# Patient Record
Sex: Male | Born: 1955 | Race: White | Hispanic: No | Marital: Married | Smoking: Current every day smoker
Health system: Southern US, Community
[De-identification: ages and names within clinical notes are randomized; demographics above are authoritative.]

## PROBLEM LIST (undated history)

## (undated) DIAGNOSIS — F319 Bipolar disorder, unspecified: Secondary | ICD-10-CM

## (undated) DIAGNOSIS — I2129 ST elevation (STEMI) myocardial infarction involving other sites: Secondary | ICD-10-CM

## (undated) DIAGNOSIS — F17200 Nicotine dependence, unspecified, uncomplicated: Secondary | ICD-10-CM

## (undated) DIAGNOSIS — I2510 Atherosclerotic heart disease of native coronary artery without angina pectoris: Secondary | ICD-10-CM

## (undated) DIAGNOSIS — I469 Cardiac arrest, cause unspecified: Secondary | ICD-10-CM

---

## 2004-03-31 ENCOUNTER — Other Ambulatory Visit: Payer: Self-pay

## 2004-03-31 ENCOUNTER — Inpatient Hospital Stay: Payer: Self-pay | Admitting: Cardiology

## 2004-04-02 ENCOUNTER — Other Ambulatory Visit: Payer: Self-pay

## 2004-04-22 ENCOUNTER — Encounter: Payer: Self-pay | Admitting: Cardiology

## 2004-05-11 ENCOUNTER — Encounter: Payer: Self-pay | Admitting: Cardiology

## 2004-06-08 ENCOUNTER — Emergency Department: Payer: Self-pay | Admitting: Internal Medicine

## 2005-01-20 ENCOUNTER — Other Ambulatory Visit: Payer: Self-pay

## 2005-01-20 ENCOUNTER — Emergency Department: Payer: Self-pay | Admitting: Emergency Medicine

## 2005-06-28 ENCOUNTER — Emergency Department: Payer: Self-pay | Admitting: Unknown Physician Specialty

## 2006-11-10 ENCOUNTER — Emergency Department: Payer: Self-pay | Admitting: Emergency Medicine

## 2007-03-04 ENCOUNTER — Emergency Department: Payer: Self-pay | Admitting: Emergency Medicine

## 2007-03-17 ENCOUNTER — Ambulatory Visit: Payer: Self-pay | Admitting: Urology

## 2008-03-14 ENCOUNTER — Emergency Department: Payer: Self-pay | Admitting: Emergency Medicine

## 2008-03-30 ENCOUNTER — Emergency Department: Payer: Self-pay | Admitting: Emergency Medicine

## 2009-02-09 ENCOUNTER — Emergency Department: Payer: Self-pay | Admitting: Emergency Medicine

## 2009-03-19 ENCOUNTER — Ambulatory Visit: Payer: Self-pay | Admitting: Unknown Physician Specialty

## 2009-03-20 ENCOUNTER — Ambulatory Visit: Payer: Self-pay | Admitting: Unknown Physician Specialty

## 2011-12-11 ENCOUNTER — Emergency Department: Payer: Self-pay | Admitting: Emergency Medicine

## 2011-12-24 ENCOUNTER — Encounter: Payer: Self-pay | Admitting: Nurse Practitioner

## 2011-12-24 ENCOUNTER — Encounter: Payer: Self-pay | Admitting: Cardiothoracic Surgery

## 2012-01-10 ENCOUNTER — Encounter: Payer: Self-pay | Admitting: Nurse Practitioner

## 2012-01-10 ENCOUNTER — Encounter: Payer: Self-pay | Admitting: Cardiothoracic Surgery

## 2012-02-11 ENCOUNTER — Encounter: Payer: Self-pay | Admitting: Orthopedic Surgery

## 2014-02-07 ENCOUNTER — Encounter (HOSPITAL_COMMUNITY)
Admission: EM | Disposition: A | Discharge status: Home / Self Care | Payer: Self-pay | Source: Home / Self Care | Attending: Interventional Cardiology

## 2014-02-07 ENCOUNTER — Inpatient Hospital Stay (HOSPITAL_COMMUNITY): Payer: PRIVATE HEALTH INSURANCE

## 2014-02-07 ENCOUNTER — Encounter (HOSPITAL_COMMUNITY): Payer: Self-pay | Admitting: Interventional Cardiology

## 2014-02-07 ENCOUNTER — Emergency Department: Payer: Self-pay | Admitting: Emergency Medicine

## 2014-02-07 ENCOUNTER — Inpatient Hospital Stay (HOSPITAL_COMMUNITY)
Admission: EM | Admit: 2014-02-07 | Discharge: 2014-02-10 | DRG: 250 | Disposition: A | Discharge status: Home / Self Care | Payer: PRIVATE HEALTH INSURANCE | Attending: Interventional Cardiology | Admitting: Interventional Cardiology

## 2014-02-07 DIAGNOSIS — F32A Depression, unspecified: Secondary | ICD-10-CM

## 2014-02-07 DIAGNOSIS — I469 Cardiac arrest, cause unspecified: Secondary | ICD-10-CM | POA: Diagnosis not present

## 2014-02-07 DIAGNOSIS — Z955 Presence of coronary angioplasty implant and graft: Secondary | ICD-10-CM

## 2014-02-07 DIAGNOSIS — I209 Angina pectoris, unspecified: Secondary | ICD-10-CM

## 2014-02-07 DIAGNOSIS — E876 Hypokalemia: Secondary | ICD-10-CM | POA: Diagnosis not present

## 2014-02-07 DIAGNOSIS — Y712 Prosthetic and other implants, materials and accessory cardiovascular devices associated with adverse incidents: Secondary | ICD-10-CM | POA: Diagnosis present

## 2014-02-07 DIAGNOSIS — T82857A Stenosis of cardiac prosthetic devices, implants and grafts, initial encounter: Principal | ICD-10-CM | POA: Diagnosis present

## 2014-02-07 DIAGNOSIS — I25119 Atherosclerotic heart disease of native coronary artery with unspecified angina pectoris: Secondary | ICD-10-CM

## 2014-02-07 DIAGNOSIS — I2129 ST elevation (STEMI) myocardial infarction involving other sites: Secondary | ICD-10-CM | POA: Diagnosis present

## 2014-02-07 DIAGNOSIS — F319 Bipolar disorder, unspecified: Secondary | ICD-10-CM | POA: Clinically undetermined

## 2014-02-07 DIAGNOSIS — Z716 Tobacco abuse counseling: Secondary | ICD-10-CM | POA: Diagnosis present

## 2014-02-07 DIAGNOSIS — F17200 Nicotine dependence, unspecified, uncomplicated: Secondary | ICD-10-CM | POA: Diagnosis present

## 2014-02-07 DIAGNOSIS — F3162 Bipolar disorder, current episode mixed, moderate: Secondary | ICD-10-CM

## 2014-02-07 DIAGNOSIS — F329 Major depressive disorder, single episode, unspecified: Secondary | ICD-10-CM

## 2014-02-07 DIAGNOSIS — I959 Hypotension, unspecified: Secondary | ICD-10-CM

## 2014-02-07 DIAGNOSIS — I2102 ST elevation (STEMI) myocardial infarction involving left anterior descending coronary artery: Secondary | ICD-10-CM

## 2014-02-07 DIAGNOSIS — J9600 Acute respiratory failure, unspecified whether with hypoxia or hypercapnia: Secondary | ICD-10-CM

## 2014-02-07 DIAGNOSIS — I4901 Ventricular fibrillation: Secondary | ICD-10-CM | POA: Diagnosis present

## 2014-02-07 DIAGNOSIS — I2510 Atherosclerotic heart disease of native coronary artery without angina pectoris: Secondary | ICD-10-CM | POA: Diagnosis not present

## 2014-02-07 DIAGNOSIS — Z8674 Personal history of sudden cardiac arrest: Secondary | ICD-10-CM

## 2014-02-07 HISTORY — DX: Cardiac arrest, cause unspecified: I46.9

## 2014-02-07 HISTORY — DX: ST elevation (STEMI) myocardial infarction involving other sites: I21.29

## 2014-02-07 HISTORY — DX: Bipolar disorder, unspecified: F31.9

## 2014-02-07 HISTORY — DX: Nicotine dependence, unspecified, uncomplicated: F17.200

## 2014-02-07 HISTORY — PX: LEFT HEART CATHETERIZATION WITH CORONARY ANGIOGRAM: SHX5451

## 2014-02-07 HISTORY — DX: Atherosclerotic heart disease of native coronary artery without angina pectoris: I25.10

## 2014-02-07 LAB — POCT I-STAT 3, ART BLOOD GAS (G3+)
ACID-BASE EXCESS: 1 mmol/L (ref 0.0–2.0)
Bicarbonate: 24.2 mEq/L — ABNORMAL HIGH (ref 20.0–24.0)
O2 Saturation: 99 %
PH ART: 7.452 — AB (ref 7.350–7.450)
TCO2: 25 mmol/L (ref 0–100)
pCO2 arterial: 34.5 mmHg — ABNORMAL LOW (ref 35.0–45.0)
pO2, Arterial: 137 mmHg — ABNORMAL HIGH (ref 80.0–100.0)

## 2014-02-07 LAB — COMPREHENSIVE METABOLIC PANEL
ALBUMIN: 3.4 g/dL (ref 3.4–5.0)
ALBUMIN: 3 g/dL — AB (ref 3.5–5.2)
ALK PHOS: 144 U/L — AB
ALK PHOS: 125 U/L — AB (ref 39–117)
ALT: 120 U/L — AB (ref 0–53)
AST: 62 U/L — AB (ref 15–37)
AST: 160 U/L — AB (ref 0–37)
Anion Gap: 9 (ref 7–16)
Anion gap: 11 (ref 5–15)
BUN: 13 mg/dL (ref 7–18)
BUN: 14 mg/dL (ref 6–23)
Bilirubin,Total: 0.2 mg/dL (ref 0.2–1.0)
CALCIUM: 8.1 mg/dL — AB (ref 8.4–10.5)
CHLORIDE: 107 mmol/L (ref 98–107)
CO2: 27 mmol/L (ref 21–32)
CO2: 25 mEq/L (ref 19–32)
CREATININE: 1.05 mg/dL (ref 0.60–1.30)
Calcium, Total: 8.6 mg/dL (ref 8.5–10.1)
Chloride: 104 mEq/L (ref 96–112)
Creatinine, Ser: 0.87 mg/dL (ref 0.50–1.35)
EGFR (African American): >60
EGFR (Non-African Amer.): >60
GFR calc Af Amer: >90 mL/min (ref >90)
GFR calc non Af Amer: >90 mL/min (ref >90)
GLUCOSE: 104 mg/dL — AB (ref 65–99)
Glucose, Bld: 207 mg/dL — ABNORMAL HIGH (ref 70–99)
OSMOLALITY: 285 (ref 275–301)
POTASSIUM: 3.2 meq/L — AB (ref 3.7–5.3)
Potassium: 3.4 mmol/L — ABNORMAL LOW (ref 3.5–5.1)
SGPT (ALT): 67 U/L — ABNORMAL HIGH
SODIUM: 143 mmol/L (ref 136–145)
SODIUM: 140 meq/L (ref 137–147)
TOTAL PROTEIN: 6.4 g/dL (ref 6.0–8.3)
Total Bilirubin: 0.4 mg/dL (ref 0.3–1.2)
Total Protein: 7.4 g/dL (ref 6.4–8.2)

## 2014-02-07 LAB — GLUCOSE, CAPILLARY
GLUCOSE-CAPILLARY: 189 mg/dL — AB (ref 70–99)
Glucose-Capillary: 129 mg/dL — ABNORMAL HIGH (ref 70–99)

## 2014-02-07 LAB — CBC
HCT: 50.4 % (ref 40.0–52.0)
HEMATOCRIT: 40.8 % (ref 39.0–52.0)
HEMOGLOBIN: 14 g/dL (ref 13.0–17.0)
HGB: 16.5 g/dL (ref 13.0–18.0)
MCH: 29.3 pg (ref 26.0–34.0)
MCH: 29.6 pg (ref 26.0–34.0)
MCHC: 32.6 g/dL (ref 32.0–36.0)
MCHC: 34.3 g/dL (ref 30.0–36.0)
MCV: 90 fL (ref 80–100)
MCV: 86.3 fL (ref 78.0–100.0)
PLATELETS: 202 10*3/uL (ref 150–440)
Platelets: 196 10*3/uL (ref 150–400)
RBC: 5.62 10*6/uL (ref 4.40–5.90)
RBC: 4.73 MIL/uL (ref 4.22–5.81)
RDW: 15.4 % — ABNORMAL HIGH (ref 11.5–14.5)
RDW: 15 % (ref 11.5–15.5)
WBC: 14.2 10*3/uL — AB (ref 3.8–10.6)
WBC: 14.1 10*3/uL — ABNORMAL HIGH (ref 4.0–10.5)

## 2014-02-07 LAB — CK TOTAL AND CKMB (NOT AT ARMC)
CK TOTAL: 821 U/L — AB (ref 7–232)
CK TOTAL: 1356 U/L — AB (ref 7–232)
CK, MB: 32.8 ng/mL — AB (ref 0.3–4.0)
CK, MB: 110.8 ng/mL (ref 0.3–4.0)
CK, MB: 65.5 ng/mL — AB (ref 0.3–4.0)
RELATIVE INDEX: 4 — AB (ref 0.0–2.5)
RELATIVE INDEX: 6.1 — AB (ref 0.0–2.5)
RELATIVE INDEX: 4.8 — AB (ref 0.0–2.5)
Total CK: 1812 U/L — ABNORMAL HIGH (ref 7–232)

## 2014-02-07 LAB — APTT: aPTT: >200 seconds (ref 24–37)

## 2014-02-07 LAB — PROTIME-INR
INR: 1.23 (ref 0.00–1.49)
Prothrombin Time: 15.5 seconds — ABNORMAL HIGH (ref 11.6–15.2)

## 2014-02-07 LAB — LIPID PANEL
CHOL/HDL RATIO: 4.6 ratio
CHOLESTEROL: 147 mg/dL (ref 0–200)
HDL: 32 mg/dL — AB (ref >39)
LDL Cholesterol: 100 mg/dL — ABNORMAL HIGH (ref 0–99)
TRIGLYCERIDES: 76 mg/dL (ref <150)
VLDL: 15 mg/dL (ref 0–40)

## 2014-02-07 LAB — TROPONIN I
Troponin I: >20 ng/mL (ref <0.30)
Troponin I: >20 ng/mL (ref <0.30)
Troponin-I: <0.02 ng/mL

## 2014-02-07 LAB — RAPID URINE DRUG SCREEN, HOSP PERFORMED
Amphetamines: NOT DETECTED
Barbiturates: NOT DETECTED
Benzodiazepines: POSITIVE — AB
Cocaine: NOT DETECTED
OPIATES: NOT DETECTED
TETRAHYDROCANNABINOL: NOT DETECTED

## 2014-02-07 LAB — BASIC METABOLIC PANEL
ANION GAP: 14 (ref 5–15)
BUN: 13 mg/dL (ref 6–23)
CALCIUM: 8.3 mg/dL — AB (ref 8.4–10.5)
CO2: 23 mEq/L (ref 19–32)
CREATININE: 0.72 mg/dL (ref 0.50–1.35)
Chloride: 106 mEq/L (ref 96–112)
Glucose, Bld: 100 mg/dL — ABNORMAL HIGH (ref 70–99)
Potassium: 3.9 mEq/L (ref 3.7–5.3)
SODIUM: 143 meq/L (ref 137–147)

## 2014-02-07 LAB — PLATELET COUNT: PLATELETS: 199 10*3/uL (ref 150–400)

## 2014-02-07 LAB — HEMOGLOBIN A1C
Hgb A1c MFr Bld: 5.7 % — ABNORMAL HIGH (ref <5.7)
Mean Plasma Glucose: 117 mg/dL — ABNORMAL HIGH (ref <117)

## 2014-02-07 LAB — POCT ACTIVATED CLOTTING TIME
ACTIVATED CLOTTING TIME: 185 s
ACTIVATED CLOTTING TIME: 320 s
Activated Clotting Time: 214 seconds

## 2014-02-07 SURGERY — LEFT HEART CATHETERIZATION WITH CORONARY ANGIOGRAM
Anesthesia: LOCAL

## 2014-02-07 MED ORDER — NITROGLYCERIN 1 MG/10 ML FOR IR/CATH LAB
INTRA_ARTERIAL | Status: AC
  Filled 2014-02-07: qty 10

## 2014-02-07 MED ORDER — CETYLPYRIDINIUM CHLORIDE 0.05 % MT LIQD
7.0000 mL | Freq: Two times a day (BID) | OROMUCOSAL | Status: DC
  Administered 2014-02-07 – 2014-02-09 (×5): 7 mL via OROMUCOSAL

## 2014-02-07 MED ORDER — ACETAMINOPHEN 325 MG PO TABS
650.0000 mg | ORAL_TABLET | ORAL | Status: DC | PRN
  Administered 2014-02-09 (×2): 650 mg via ORAL
  Filled 2014-02-07 (×2): qty 2

## 2014-02-07 MED ORDER — AMIODARONE HCL IN DEXTROSE 360-4.14 MG/200ML-% IV SOLN
60.0000 mg/h | INTRAVENOUS | Status: AC
  Administered 2014-02-07: 60 mg/h via INTRAVENOUS
  Filled 2014-02-07: qty 200

## 2014-02-07 MED ORDER — HEPARIN SODIUM (PORCINE) 1000 UNIT/ML IJ SOLN
INTRAMUSCULAR | Status: AC
  Filled 2014-02-07: qty 1

## 2014-02-07 MED ORDER — CHLORHEXIDINE GLUCONATE 0.12 % MT SOLN
15.0000 mL | Freq: Two times a day (BID) | OROMUCOSAL | Status: DC
  Administered 2014-02-07: 15 mL via OROMUCOSAL
  Filled 2014-02-07 (×2): qty 15

## 2014-02-07 MED ORDER — ONDANSETRON HCL 4 MG/2ML IJ SOLN: 4.0000 mg | Freq: Four times a day (QID) | INTRAMUSCULAR | Status: DC | PRN

## 2014-02-07 MED ORDER — HEPARIN (PORCINE) IN NACL 2-0.9 UNIT/ML-% IJ SOLN
INTRAMUSCULAR | Status: AC
Start: 2014-02-07 — End: 2014-02-07
  Filled 2014-02-07: qty 1500

## 2014-02-07 MED ORDER — TICAGRELOR 90 MG PO TABS
ORAL_TABLET | ORAL | Status: AC
  Filled 2014-02-07: qty 1

## 2014-02-07 MED ORDER — TIROFIBAN HCL IV 5 MG/100ML
INTRAVENOUS | Status: AC
  Filled 2014-02-07: qty 100

## 2014-02-07 MED ORDER — SODIUM CHLORIDE 0.9 % IV SOLN
INTRAVENOUS | Status: AC
  Administered 2014-02-07: 12:00:00 via INTRAVENOUS

## 2014-02-07 MED ORDER — POTASSIUM CHLORIDE 10 MEQ/100ML IV SOLN
10.0000 meq | INTRAVENOUS | Status: AC
  Administered 2014-02-07 (×2): 10 meq via INTRAVENOUS
  Filled 2014-02-07 (×2): qty 100

## 2014-02-07 MED ORDER — LIDOCAINE HCL (PF) 1 % IJ SOLN
INTRAMUSCULAR | Status: AC
  Filled 2014-02-07: qty 30

## 2014-02-07 MED ORDER — PROPOFOL 10 MG/ML IV EMUL: 5.0000 ug/kg/min | INTRAVENOUS | Status: DC

## 2014-02-07 MED ORDER — CETYLPYRIDINIUM CHLORIDE 0.05 % MT LIQD
7.0000 mL | Freq: Four times a day (QID) | OROMUCOSAL | Status: DC
  Administered 2014-02-07 (×2): 7 mL via OROMUCOSAL

## 2014-02-07 MED ORDER — TIROFIBAN HCL IV 5 MG/100ML
0.1500 ug/kg/min | INTRAVENOUS | Status: AC
  Administered 2014-02-07: 0.15 ug/kg/min via INTRAVENOUS
  Filled 2014-02-07 (×2): qty 100

## 2014-02-07 MED ORDER — ASPIRIN 81 MG PO CHEW
81.0000 mg | CHEWABLE_TABLET | Freq: Every day | ORAL | Status: DC
  Administered 2014-02-08 – 2014-02-10 (×3): 81 mg via ORAL
  Filled 2014-02-07 (×3): qty 1

## 2014-02-07 MED ORDER — POTASSIUM CHLORIDE 20 MEQ/15ML (10%) PO LIQD
40.0000 meq | Freq: Three times a day (TID) | ORAL | Status: DC
Start: 2014-02-07 — End: 2014-02-07
  Filled 2014-02-07 (×2): qty 30

## 2014-02-07 MED ORDER — AMIODARONE HCL IN DEXTROSE 360-4.14 MG/200ML-% IV SOLN
30.0000 mg/h | INTRAVENOUS | Status: DC
  Administered 2014-02-07 – 2014-02-08 (×3): 30 mg/h via INTRAVENOUS
  Filled 2014-02-07 (×5): qty 200

## 2014-02-07 MED ORDER — TICAGRELOR 90 MG PO TABS
90.0000 mg | ORAL_TABLET | Freq: Two times a day (BID) | ORAL | Status: DC
  Administered 2014-02-07 – 2014-02-10 (×6): 90 mg via ORAL
  Filled 2014-02-07 (×9): qty 1

## 2014-02-07 MED ORDER — HEPARIN SODIUM (PORCINE) 5000 UNIT/ML IJ SOLN
5000.0000 [IU] | Freq: Three times a day (TID) | INTRAMUSCULAR | Status: DC
  Administered 2014-02-07 – 2014-02-10 (×9): 5000 [IU] via SUBCUTANEOUS
  Filled 2014-02-07 (×12): qty 1

## 2014-02-07 MED ORDER — VERAPAMIL HCL 2.5 MG/ML IV SOLN
INTRAVENOUS | Status: AC
  Filled 2014-02-07: qty 2

## 2014-02-07 NOTE — Progress Notes (Signed)
Bedside swallow performed by RN. No signs of aspiration, lungs sounds clear. No complaints from patient except for slight tenderness in throat. Rise PaganiniURRY, Dametri Ozburn R, RN

## 2014-02-07 NOTE — Progress Notes (Signed)
**Note De-Identified Ian Soto Obfuscation** ETT advanced 3cm; currently 25 @ lip

## 2014-02-07 NOTE — Progress Notes (Signed)
Attempted to advance OG tube due to x-ray result. OG tube removed for advancement, and then tried to re-insert. Unable to re-insert OG tube x3 attemps. Did not attempt any further due to patient becoming agitated/increase of respiratory rate and increased coughing/gagging and fighting against the tube. Will attempt to re-insert at a later time. Rise PaganiniURRY, Shriyans Kuenzi R, RN

## 2014-02-07 NOTE — H&P (Signed)
Ian Soto is an 58 y.o. male.   Primary Cardiologist: Dr. Lady GaryFath PMD: Chief Complaint: chest pain HPI: 58 year old man with a history of coronary artery disease who has had PCI in the past. He had approximately 3 hours of chest discomfort. He called EMS and upon arrival to the [place] [facility], became unresponsive. He had cardiac arrest with his initial rhythm being ventricular fibrillation. He was apparently defibrillated once. He had 5-less than 10 minutes of CPR by one report. He had less than a minute of CPR by another report.  He apparently did receive epinephrine and sodium bicarbonate. Due to "agonal" breathing, he was intubated after return of spontaneous circulation. He is transferred here for emergent cardiac catheterization. Patient is intubated and unable to give any further history. All history is obtained from personnel.    No past medical history on file.  No past surgical history on file.  No family history on file. Social History:  has no tobacco, alcohol, and drug history on file.  Allergies: Allergies not on file  No prescriptions prior to admission    No results found for this or any previous visit (from the past 48 hour(s)). No results found.  ROS: Unable to obtain   OBJECTIVE:   Vitals:  There were no vitals filed for this visit. I&O's:  No intake or output data in the 24 hours ending 02/07/14 0531 TELEMETRY: Reviewed telemetry pt in normal sinus rhythm. ECG did show high degree AV block after de are fibrillation transiently:     PHYSICAL EXAM General:  intubated sedated  Head:   Normal cephalic and atramatic  Lungs:  Coarse breath sounds  Heart:   HRRR S1 S2   Abdomen: abdomen soft and non-tender Msk:   unable to assess  Extremities: No edema.   Neuro: intubated sedated  Psych:  Normal affect, responds appropriately  LABS: Basic Metabolic Panel: No results found for this basename: NA, K, CL, CO2, GLUCOSE, BUN, CREATININE, CALCIUM, MG, PHOS,  in  the last 72 hours Liver Function Tests: No results found for this basename: AST, ALT, ALKPHOS, BILITOT, PROT, ALBUMIN,  in the last 72 hours No results found for this basename: LIPASE, AMYLASE,  in the last 72 hours CBC: No results found for this basename: WBC, NEUTROABS, HGB, HCT, MCV, PLT,  in the last 72 hours Cardiac Enzymes: No results found for this basename: CKTOTAL, CKMB, CKMBINDEX, TROPONINI,  in the last 72 hours BNP: No components found with this basename: POCBNP,  D-Dimer: No results found for this basename: DDIMER,  in the last 72 hours Hemoglobin A1C: No results found for this basename: HGBA1C,  in the last 72 hours Fasting Lipid Panel: No results found for this basename: CHOL, HDL, LDLCALC, TRIG, CHOLHDL, LDLDIRECT,  in the last 72 hours Thyroid Function Tests: No results found for this basename: TSH, T4TOTAL, FREET3, T3FREE, THYROIDAB,  in the last 72 hours Anemia Panel: No results found for this basename: VITAMINB12, FOLATE, FERRITIN, TIBC, IRON, RETICCTPCT,  in the last 72 hours Coag Panel:   No results found for this basename: INR, PROTIME       Assessment/Plan Acute lateral wall MI/cardiac arrest: IV amiodarone. Emergency cardiac cath. Further plans based on cath results. Only medication that he was on noted was metoprolol. No Plavix use as far as we know. The patient did receive IV heparin.   Spoke to Constellation BrandsPCCM.  Not planning hypothermia protocol due to short duration of the arrest.  Emilly Lavey S. 02/07/2014, 5:31 AM

## 2014-02-07 NOTE — Progress Notes (Signed)
LB/PCCM PROGRESS NOTE  Evaluated patient at bedside for extubation. Ian Soto was spontaneously awake, alert, and oriented and able to communicate through writing. He had been weaning for several hours and had an RSBI of 15. He was oxygenating well. He did complain of some throat tightness, however, a cuff leak test was performed and he had adequate phonation around the ETT. He was extubated. He is currently breathing comfortably and oxygenating well on [place]. Will continue to monitor.   Joneen RoachPaul Hoffman, ACNP [facility] Pulmonology/Critical Care Pager [phone] or [phone]

## 2014-02-07 NOTE — Progress Notes (Signed)
Foley catheter removed at 1730. Rise PaganiniURRY, Giovanni Biby R, RN

## 2014-02-07 NOTE — Progress Notes (Signed)
CRITICAL VALUE ALERT  Critical value received:  CK MB 32.8, trop >20  Date of notification:  02/07/2014  Time of notification:  0752  Critical value read back:Yes.    Nurse who received alert:  Viviana Trimble, Jeanice LimHolly Cartner  Pt admitted as Ian Soto,  post cath procedure with intervention, value expected.

## 2014-02-07 NOTE — Consult Note (Signed)
PULMONARY / CRITICAL CARE MEDICINE   Name: Ian Soto MRN: [id number] DOB: 10/21/1955    ADMISSION DATE:  02/07/2014 CONSULTATION DATE:  9/30  REFERRING MD :  Cards  CHIEF COMPLAINT:  Chest pain  INITIAL PRESENTATION: Vented to cath lab post V fib arrest  STUDIES:  CC 9/30 with diagonal aspiration   SIGNIFICANT EVENTS: 9/30 v fib arrst   HISTORY OF PRESENT ILLNESS:   58 [facility] WM [facility] 7/10 and in ED developed VF arrest. CPR x 5 min , one epi with ROSC. Intubated for transport and [facility] with aspiration of first diagonal. Pervious LAD stent patent. Post cath awake and follows commands, therefore no hypothermia protocol. He carries diagnosis of depression and bipolar disorder and is on depakote.  PAST MEDICAL HISTORY :   has a past medical history of Cardiac arrest; Acute myocardial infarction of other lateral wall, initial episode of care; and Coronary atherosclerosis of native coronary artery.  has no past surgical history on file. Prior to Admission medications   Not on File   Allergies not on file nkda FAMILY HISTORY:  has no family status information on file.  na SOCIAL HISTORY:  na  REVIEW OF SYSTEMS:  NA  SUBJECTIVE:   VITAL SIGNS: Temp:  [97.4 F (36.3 C)] 97.4 F (36.3 C) (09/30 0722) Pulse Rate:  [63] 63 (09/30 0534) Resp:  [14] 14 (09/30 0722) BP: (105)/(77) 105/77 mmHg (09/30 0722) SpO2:  [100 %] 100 % (09/30 0722) FiO2 (%):  [100 %] 100 % (09/30 0535) HEMODYNAMICS:   VENTILATOR SETTINGS: Vent Mode:  [-] PRVC FiO2 (%):  [100 %] 100 % Set Rate:  [14 bmp] 14 bmp Vt Set:  [600 mL] 600 mL PEEP:  [5 cmH20] 5 cmH20 INTAKE / OUTPUT: No intake or output data in the 24 hours ending 02/07/14 0746  PHYSICAL EXAMINATION: General:  Sedate WM on vent  Neuro:  Reported to follow commands HEENT:  No jvd/LAN Cardiovascular:  HSR RRR Lungs: CTA Abdomen: Soft + bs Musculoskeletal:  Intact Skin:  Cool  and dry  LABS:  CBC  Recent Labs Lab 02/07/14 0645  WBC 14.1*  HGB 14.0  HCT 40.8  PLT 196   Coag's  Recent Labs Lab 02/07/14 0645  INR 1.23   BMET No results found for this basename: NA, K, CL, CO2, BUN, CREATININE, GLUCOSE,  in the last 168 hours Electrolytes No results found for this basename: CALCIUM, MG, PHOS,  in the last 168 hours Sepsis Markers No results found for this basename: LATICACIDVEN, PROCALCITON, O2SATVEN,  in the last 168 hours ABG No results found for this basename: PHART, PCO2ART, PO2ART,  in the last 168 hours Liver Enzymes No results found for this basename: AST, ALT, ALKPHOS, BILITOT, ALBUMIN,  in the last 168 hours Cardiac Enzymes No results found for this basename: TROPONINI, PROBNP,  in the last 168 hours Glucose No results found for this basename: GLUCAP,  in the last 168 hours  Imaging No results found.   ASSESSMENT / PLAN:  PULMONARY OETT 9/30>> A: Intubated post arrest for transport P:   Wean as tolerated Vent bundle Will check CXR and ABG now, if appears within tolerance will consider a wean to extubate. SBT daily.  CARDIOVASCULAR CVL A:  Post V fib arrest 9/30 Post CC 9/30 with diagonal aspiration  Previous LAD stent Was evidently purposeful upon [facility] and downtime <5 minutes in the ambulance only. P:  Per Cards Recommend decreasing propofol as tolerated inorder to avoid placement on TLC and pressors. Not a cooling candidate.  RENAL A:   Hypokalemia P:   Follow creatine post CC BMET in AM. Replace electrolytes as indicated.  GASTROINTESTINAL A:   No acute issue P:   PPI If unable to extubate today then will start TF.  HEMATOLOGIC A:   Post CC on anticoagulation P:  Follow CBC  INFECTIOUS A:  No acute process P:   Monitor clinically  ENDOCRINE A:   No acute process  P:   Monitor CBGs.  NEUROLOGIC A:   Post V fib arrest, <5  Minutes down time with quality cpr. Awake and follows  commands post CC Depression Bpolar P:   RASS goal: -1 No hypothermia Neuro evals as needed Resume Depakote in future  Family updated:  9/30 0700 , no family available.  Interdisciplinary Family Meeting v Palliative Care Meeting:   [facility] Minor ACNP Adolph Pollack PCCM Pager [phone] till 3 pm If no answer page [phone] 02/07/2014, 7:57 AM  TODAY'S SUMMARY:  58 yo with hx previous LAD stent who presented to [facility] with chest pain, V fib arrest with < 5 minutes downtime. CPR x 5 min, epi x 1,tx to [facility] for aspiration of first diagonal per CC.  Above note edited in full.  I have personally obtained a history, examined the patient, evaluated laboratory and imaging results, formulated the assessment and plan and placed orders. CRITICAL CARE: The patient is critically ill with multiple organ systems failure and requires high complexity decision making for assessment and support, frequent evaluation and titration of therapies, application of advanced monitoring technologies and extensive interpretation of multiple databases. Critical Care Time devoted to patient care services described in this note is 55 minutes.   Alyson Reedy, M.D. [facility] Pulmonary/Critical Care Medicine. Pager: [phone]. After hours pager: [phone].  02/07/2014, 7:46 AM

## 2014-02-07 NOTE — Progress Notes (Signed)
Inpatient Diabetes Program Recommendations  AACE/ADA: New Consensus Statement on Inpatient Glycemic Control (2013)  Target Ranges:  Prepandial:   less than 140 mg/dL      Peak postprandial:   less than 180 mg/dL (1-2 hours)      Critically ill patients:  140 - 180 mg/dL   Results for Ian Soto, Lavi K (MRN [id number]) as of 02/07/2014 11:10  Ref. Range 02/07/2014 06:45  Glucose Latest Range: 70-99 mg/dL 244207 (H)   Results for Ian Soto, Lajuan K (MRN [id number]) as of 02/07/2014 11:10  Ref. Range 02/07/2014 07:20  Glucose-Capillary Latest Range: 70-99 mg/dL 644189 (H)   Diabetes history: No Outpatient Diabetes medications: NA Current orders for Inpatient glycemic control: None  Inpatient Diabetes Program Recommendations Correction (SSI): Noted initial lab glucose of 207mg /dl this morning at 0:346:45 and finger stick glucose 189 mg/dl at 7:427:20 am. Please consider ordering CBGs with Novolog correction Q4H if appropriate. HgbA1C: No documented history of diabetes noted in chart. Please consider ordering an A1C to evaluate glycemic control over the past 2-3 months.  Thanks, Orlando PennerMarie Chyna Kneece, RN, MSN, CCRN  Diabetes Coordinator Inpatient Diabetes Program [phone] (Team Pager) [phone] (AP office) [phone] [facility] office)

## 2014-02-07 NOTE — CV Procedure (Addendum)
PROCEDURE:  Left heart catheterization with selective coronary angiography, left ventriculogram.  Percutaneous thrombectomy of the first diagonal; PCI first diagonal; IVUS LAD.  INDICATIONS:  Cardiac arrest, lateral ST elevation MI  Emergency consent  PROCEDURE TECHNIQUE:  After Xylocaine anesthesia a 50F slender sheath was placed in the right radial artery with a single anterior needle wall stick.  IV heparin was given. Right coronary angiography was done using a Judkins R4 guide catheter.  Left coronary angiography was done using a CLS 3.0 guide catheter.  The diagonal intervention was performed. Please see below for details. Intravascular ultrasound of the LAD was then performed. Left ventriculography was done using a pigtail catheter.  A TR band was used for hemostasis.   CONTRAST:  Total of 125 cc.  COMPLICATIONS:  None.    HEMODYNAMICS:  Aortic pressure was 95/70; LV pressure was 96/9; LVEDP 14.  There was no gradient between the left ventricle and aorta.    ANGIOGRAPHIC DATA:   The left main coronary artery is widely patent.  The left anterior descending artery is a large vessel which wraps around the apex. There is a stent in the proximal LAD. There is haziness in the proximal portion of the stent. There is mild to moderate disease at the proximal edge of the stent. Before the origin of the stent, there is some dye staining noted in the territory of the first diagonal.  The left circumflex artery is a large vessel. There is a large, branching first obtuse marginal which is widely patent. The remainder of the circumflex is medium size and widely patent.  The right coronary artery is a large, dominant vessel. There is mild, proximal disease. The posterior descending artery is large and widely patent. The posterior lateral artery is medium size and widely patent.  LEFT VENTRICULOGRAM:  Left ventricular angiogram was done in the 30 RAO projection and revealed anterolateral  hypokinesis and moderately decreased systolic function with an estimated ejection fraction of 40 %.  LVEDP was 14 mmHg.  PCI NARRATIVE: A CLS 3.0 guiding catheter was used to engage the left main. IV heparin was given. ACT was used to check that the heparin was therapeutic. IV tirofiban was started. A pro-water wire was placed down into the LAD. A BMW wire was then advanced into the first diagonal. A pro-water was removed and a priority 1 aspiration catheter was advanced into the diagonal vessel which was occluded. One pass was made. A small amount of debris was removed. A 2.0 x 12 balloon was then used to open up the first diagonal. Multiple inflations were performed. TIMI-3 flow was restored into the diagonal. The vessel increased in size after several doses of intra-coronary nitroglycerin. It still was not bigger than a 2.0 vessel. We went back with a 2.0 balloon and did several more inflations at higher pressure. There is no visible dissection. There is a approximately 40% stenosis in the mid vessel. Given that there is TIMI-3 flow in the vessel was small in diameter, we elected not to stent.  Due to the haziness in the LAD near the origin of this diagonal, there is concern for thrombus in the previously placed LAD stent. A pro-water was placed back into the LAD. The intravascular ultrasound catheter was advanced and IVUS of the LAD was performed. There were several areas of in-stent restenosis, but the luminal area never decreased below 4 mm.   The wires were removed. TIMI-3 flow was preserved in the LAD and  in the first diagonal.  IMPRESSIONS:  1. Normal left main coronary artery. 2.  Patent stent in the proximal left anterior descending artery  with moderate in-stent restenosis. By intravascular ultrasound, the cross-sectional luminal area did not decrease below 4 mm. Occluded first diagonal which was successfully treated with balloon angioplasty (2.0 mm x 8 balloon), with restoration of TIMI-3  flow. 3.  Patent left circumflex artery and its branches. 4.  Mild disease in the  right coronary artery. 5.  Moderately decreased left ventricular systolic functionWith anterolateral wall motion abnormality.  LVEDP 14  mmHg.  Ejection fraction 40%.  RECOMMENDATION:  The patient be watched in the ICU. I discussed the case with PCCM.  The patient will not be started on the hypothermia protocol. Continue dual antiplatelet therapy for now. It is not mandatory that he maintain dual antiplatelet therapy since a fresh stent was not placed. The diagonal vessel that was occluded his long and does feed a fair amount of myocardium, despite the fact that it is small in diameter. This was the cause of his VF arrest.

## 2014-02-07 NOTE — Progress Notes (Signed)
**Note De-Identified Floyd Wade Obfuscation** Ventilator changed due to malfunction with ventilation. STAT ABG collected 20 minutes post change.

## 2014-02-07 NOTE — Procedures (Signed)
**Note De-Identified Antrone Walla Obfuscation** Extubation Procedure Note  Patient Details:   Name: Abbie SonsWilliam K Monnin DOB: 06/06/1955 MRN: [id number]   Airway Documentation:     Evaluation  O2 sats: stable throughout Complications: No apparent complications Patient did tolerate procedure well. Bilateral Breath Sounds: Clear Suctioning: Airway Yes + leak, no stridor noted  Sahir Tolson, Megan SalonWendy Cooper 02/07/2014, 4:25 PM

## 2014-02-07 NOTE — Progress Notes (Signed)
Chaplain responded to stemi page in ED.  EMT reported no family with patient or en route.  Chaplain remained with patient to Cath Lab and informed staff that if any family arrive where they will be waiting.  Chaplain will follow up as needed.  02/07/14 0534  Clinical Encounter Type  Visited With Patient;Health care provider  Visit Type Initial;Code;Critical Care;ED  Referral From Care management  Stress Factors  Patient Stress Factors Health changes   Erroll LunaOvercash, Darya Bigler A, Chaplain

## 2014-02-08 DIAGNOSIS — I2129 ST elevation (STEMI) myocardial infarction involving other sites: Secondary | ICD-10-CM

## 2014-02-08 DIAGNOSIS — I517 Cardiomegaly: Secondary | ICD-10-CM

## 2014-02-08 LAB — POCT I-STAT 3, ART BLOOD GAS (G3+)
Bicarbonate: 23.8 mEq/L (ref 20.0–24.0)
O2 SAT: 97 %
PCO2 ART: 36.1 mmHg (ref 35.0–45.0)
PH ART: 7.427 (ref 7.350–7.450)
Patient temperature: 98.6
TCO2: 25 mmol/L (ref 0–100)
pO2, Arterial: 84 mmHg (ref 80.0–100.0)

## 2014-02-08 LAB — BASIC METABOLIC PANEL
Anion gap: 14 (ref 5–15)
BUN: 13 mg/dL (ref 6–23)
CO2: 21 meq/L (ref 19–32)
CREATININE: 0.72 mg/dL (ref 0.50–1.35)
Calcium: 8.3 mg/dL — ABNORMAL LOW (ref 8.4–10.5)
Chloride: 105 mEq/L (ref 96–112)
GFR calc Af Amer: >90 mL/min (ref >90)
GFR calc non Af Amer: >90 mL/min (ref >90)
Glucose, Bld: 113 mg/dL — ABNORMAL HIGH (ref 70–99)
Potassium: 3.5 mEq/L — ABNORMAL LOW (ref 3.7–5.3)
Sodium: 140 mEq/L (ref 137–147)

## 2014-02-08 LAB — CBC
HEMATOCRIT: 47.5 % (ref 39.0–52.0)
Hemoglobin: 15.6 g/dL (ref 13.0–17.0)
MCH: 28.9 pg (ref 26.0–34.0)
MCHC: 32.8 g/dL (ref 30.0–36.0)
MCV: 88.1 fL (ref 78.0–100.0)
Platelets: 112 10*3/uL — ABNORMAL LOW (ref 150–400)
RBC: 5.39 MIL/uL (ref 4.22–5.81)
RDW: 15.5 % (ref 11.5–15.5)
WBC: 9.4 10*3/uL (ref 4.0–10.5)

## 2014-02-08 LAB — MAGNESIUM: Magnesium: 1.9 mg/dL (ref 1.5–2.5)

## 2014-02-08 LAB — PHOSPHORUS: Phosphorus: 2.6 mg/dL (ref 2.3–4.6)

## 2014-02-08 MED ORDER — ATORVASTATIN CALCIUM 80 MG PO TABS
80.0000 mg | ORAL_TABLET | Freq: Every day | ORAL | Status: DC
  Administered 2014-02-08 – 2014-02-09 (×2): 80 mg via ORAL
  Filled 2014-02-08 (×4): qty 1

## 2014-02-08 MED ORDER — PERFLUTREN LIPID MICROSPHERE
INTRAVENOUS | Status: AC
  Filled 2014-02-08: qty 10

## 2014-02-08 MED ORDER — PERFLUTREN LIPID MICROSPHERE
1.0000 mL | INTRAVENOUS | Status: AC | PRN
  Administered 2014-02-08: 2 mL via INTRAVENOUS
  Filled 2014-02-08: qty 10

## 2014-02-08 MED ORDER — MAGNESIUM SULFATE 40 MG/ML IJ SOLN
2.0000 g | Freq: Once | INTRAMUSCULAR | Status: AC
  Administered 2014-02-08: 2 g via INTRAVENOUS
  Filled 2014-02-08: qty 50

## 2014-02-08 MED ORDER — METOPROLOL TARTRATE 12.5 MG HALF TABLET
12.5000 mg | ORAL_TABLET | Freq: Two times a day (BID) | ORAL | Status: DC
  Administered 2014-02-08 (×2): 12.5 mg via ORAL
  Filled 2014-02-08 (×4): qty 1

## 2014-02-08 MED ORDER — POTASSIUM CHLORIDE CRYS ER 20 MEQ PO TBCR
40.0000 meq | EXTENDED_RELEASE_TABLET | Freq: Once | ORAL | Status: AC
  Administered 2014-02-08: 40 meq via ORAL
  Filled 2014-02-08: qty 2

## 2014-02-08 NOTE — Progress Notes (Signed)
Echocardiogram 2D Echocardiogram with Definity has been performed.  Seung Nidiffer 02/08/2014, 11:25 AM

## 2014-02-08 NOTE — Progress Notes (Signed)
CARDIAC REHAB PHASE I   PRE:  Rate/Rhythm: 84 SR    BP: sitting 128/71    SaO2: 99 RA  MODE:  Ambulation: 300 ft   POST:  Rate/Rhythm: 92 SR    BP: sitting 133/83     SaO2: 100 RA  Pt stiff and sore upon getting OOB (first time up). Became steadier, less stiff, with distance. No major c/o except soreness from coughing. To recliner, VSS however did have a few PVCs. Gave MI book and discussed restrictions. Also gave diet sheet to begin reading. Will f/u in am. [id number]   Ian LovettReeve, Ian Soto [place] Station[place] CES, ACSM 02/08/2014 3:14 PM

## 2014-02-08 NOTE — Progress Notes (Signed)
[facility] REPLACEMENT PROTOCOL FOR AM LAB REPLACEMENT ONLY  The patient does not apply for the [facility] Electrolyte [facility] Protocol based on the criteria listed below:    Is urine output >/= 0.5 ml/kg/hr for the last 6 hours? No. Patient's UOP is 0.4 ml/kg/hr  Abnormal electrolyte(s): K3.5   If a panic level lab has been reported, has the CCM MD in charge been notified? Yes.  .   Physician:  Myrtie Soman Alva,MD  Trena Dunavan Luverne 02/08/2014 6:03 AM

## 2014-02-08 NOTE — Care Management Note (Signed)
    Page 1 of 1   02/08/2014     2:18:33 PM CARE MANAGEMENT NOTE 02/08/2014  Patient:  Ian Soto,[name]   Account Number:  [id number]  Date Initiated:  02/08/2014  Documentation initiated by:  Junius CreamerWELL,DEBBIE  Subjective/Objective Assessment:   adm w mi     Action/Plan:   lives w wife   Anticipated DC Date:     Anticipated DC Plan:        DC Planning Services  CM consult  Medication Assistance      Choice offered to / List presented to:             Status of service:   Medicare Important Message given?   (If response is "NO", the following Medicare IM given date fields will be blank) Date Medicare IM given:   Medicare IM given by:   Date Additional Medicare IM given:   Additional Medicare IM given by:    Discharge Disposition:    Per UR Regulation:  Reviewed for med. necessity/level of care/duration of stay  If discussed at Long Length of Stay Meetings, dates discussed:    Comments:  10/1 1417 debbie Pollyann Roa rn,bsn spoke w pt. he states he has ins. gave pt 30day free and copay assist card for brilinta.

## 2014-02-08 NOTE — Progress Notes (Deleted)
CARDIAC REHAB PHASE I   PRE:  Rate/Rhythm: 90 SR    BP: sitting 91/35, standing 61/44    SaO2: 100 RA  MODE:  Ambulation: stood, back to recliner   POST:  Rate/Rhythm: 106 ST    BP: sitting 91/40     SaO2:   Pt attempted to stand however BP down to 61/44 with standing. Became dizzy and had to sit back. BP better once sitting. Left in recliner at pts request. Will f/u tomorrow. Sts she gets a tight feeling in her stomach with eating, hasn't been able to eat much. Sts it also seems that she gets SOB with eating.  1415-1440   Ian Soto CES, ACSM 02/08/2014 2:37 PM 

## 2014-02-08 NOTE — Progress Notes (Signed)
    Subjective:  Denies CP or dyspnea   Objective:  Filed Vitals:   02/08/14 0500 02/08/14 0602 02/08/14 0700 02/08/14 0730  BP: 105/67 108/64 100/67   Pulse: 51 66 71   Temp:    98.4 F (36.9 C)  TempSrc:    Oral  Resp: 19 21 21    Height:      Weight:      SpO2: 99% 98% 95%     Intake/Output from previous day:  Intake/Output Summary (Last 24 hours) at 02/08/14 0817 Last data filed at 02/08/14 0700  Gross per 24 hour  Intake 1824.66 ml  Output    755 ml  Net 1069.66 ml    Physical Exam: Physical exam: Well-developed well-nourished in no acute distress.  Skin is warm and dry.  HEENT is normal.  Neck is supple.  Chest is clear to auscultation with normal expansion.  Cardiovascular exam is regular rate and rhythm.  Abdominal exam nontender or distended. No masses palpated. Extremities show no edema. Right radial cath site with no hematoma neuro grossly intact    Lab Results: Basic Metabolic Panel:  Recent Labs  16/02/9608/30/15 2016 02/08/14 0400  NA 143 140  K 3.9 3.5*  CL 106 105  CO2 23 21  GLUCOSE 100* 113*  BUN 13 13  CREATININE 0.72 0.72  CALCIUM 8.3* 8.3*  MG  --  1.9  PHOS  --  2.6   CBC:  Recent Labs  02/07/14 0645 02/07/14 1329 02/08/14 0400  WBC 14.1*  --  9.4  HGB 14.0  --  15.6  HCT 40.8  --  47.5  MCV 86.3  --  88.1  PLT 196 199 112*   Cardiac Enzymes:  Recent Labs  02/07/14 0645 02/07/14 1329 02/07/14 2016  CKTOTAL 821* 1812* 1356*  CKMB 32.8* 110.8* 65.5*  TROPONINI >20.00* >20.00* >20.00*     Assessment/Plan:  1 status post myocardial infarction-patient had PTCA of diagonal. Continue aspirin, brilinta. Add Lipitor 80 mg daily. Add metoprolol 12.5 mg by mouth twice a day. Check echocardiogram. Blood pressure borderline. If LV function reduced will add ACE inhibitor if blood pressure allows. 2 status post cardiac arrest-occurred in the setting of acute infarct. Telemetry continues to show nonsustained ventricular  tachycardia. Add low-dose beta blocker. Discontinue amiodarone. Follow telemetry. 3 hypokalemia-supplement 4 hypomagnesemia-supplement 5 tobacco abuse-patient counseled on discontinuing.  Olga MillersBrian Crenshaw 02/08/2014, 8:17 AM

## 2014-02-09 DIAGNOSIS — Z720 Tobacco use: Secondary | ICD-10-CM

## 2014-02-09 LAB — CBC
HCT: 42 % (ref 39.0–52.0)
Hemoglobin: 14.1 g/dL (ref 13.0–17.0)
MCH: 29.7 pg (ref 26.0–34.0)
MCHC: 33.6 g/dL (ref 30.0–36.0)
MCV: 88.4 fL (ref 78.0–100.0)
Platelets: 171 10*3/uL (ref 150–400)
RBC: 4.75 MIL/uL (ref 4.22–5.81)
RDW: 15 % (ref 11.5–15.5)
WBC: 11.8 10*3/uL — ABNORMAL HIGH (ref 4.0–10.5)

## 2014-02-09 LAB — COMPREHENSIVE METABOLIC PANEL
ALT: 67 U/L — AB (ref 0–53)
AST: 62 U/L — ABNORMAL HIGH (ref 0–37)
Albumin: 3 g/dL — ABNORMAL LOW (ref 3.5–5.2)
Alkaline Phosphatase: 113 U/L (ref 39–117)
Anion gap: 13 (ref 5–15)
BUN: 13 mg/dL (ref 6–23)
CO2: 21 mEq/L (ref 19–32)
Calcium: 8.6 mg/dL (ref 8.4–10.5)
Chloride: 105 mEq/L (ref 96–112)
Creatinine, Ser: 0.78 mg/dL (ref 0.50–1.35)
GFR calc non Af Amer: >90 mL/min (ref >90)
GLUCOSE: 96 mg/dL (ref 70–99)
Potassium: 3.7 mEq/L (ref 3.7–5.3)
Sodium: 139 mEq/L (ref 137–147)
TOTAL PROTEIN: 6.8 g/dL (ref 6.0–8.3)
Total Bilirubin: 1 mg/dL (ref 0.3–1.2)

## 2014-02-09 MED ORDER — POTASSIUM CHLORIDE CRYS ER 20 MEQ PO TBCR
40.0000 meq | EXTENDED_RELEASE_TABLET | Freq: Once | ORAL | Status: AC
  Administered 2014-02-09: 40 meq via ORAL
  Filled 2014-02-09: qty 2

## 2014-02-09 MED ORDER — METOPROLOL TARTRATE 25 MG PO TABS
25.0000 mg | ORAL_TABLET | Freq: Two times a day (BID) | ORAL | Status: DC
  Administered 2014-02-09 – 2014-02-10 (×3): 25 mg via ORAL
  Filled 2014-02-09 (×3): qty 1

## 2014-02-09 NOTE — Progress Notes (Signed)
CARDIAC REHAB PHASE I   PRE:  Rate/Rhythm: 73 SR    BP: sitting 100/75    SaO2: 98 RA  MODE:  Ambulation: 500 ft   POST:  Rate/Rhythm: 85 SR    BP: sitting 117/78     SaO2: 98 RA  Tolerated well, no c/o. Ed completed. Voiced understanding and [facility]. [phone]   Elissa LovettReeve, Zophia Marrone [place]oveKristan CES, ACSM 02/09/2014 11:39 AM

## 2014-02-09 NOTE — Progress Notes (Signed)
    Subjective:  Denies CP or dyspnea   Objective:  Filed Vitals:   02/09/14 0313 02/09/14 0400 02/09/14 0600 02/09/14 0744  BP: 107/83 116/78 110/80 130/78  Pulse: 65 62 65 73  Temp: 98.3 F (36.8 C)   97.8 F (36.6 C)  TempSrc: Oral   Oral  Resp: 19 16 22 25   Height:      Weight:      SpO2: 97% 96% 98% 96%    Intake/Output from previous day:  Intake/Output Summary (Last 24 hours) at 02/09/14 0801 Last data filed at 02/09/14 0600  Gross per 24 hour  Intake    630 ml  Output   1530 ml  Net   -900 ml    Physical Exam: Physical exam: Well-developed well-nourished in no acute distress.  Skin is warm and dry.  HEENT is normal.  Neck is supple.  Chest is clear to auscultation with normal expansion.  Cardiovascular exam is regular rate and rhythm.  Abdominal exam nontender or distended. No masses palpated. Extremities show no edema. Right radial cath site with no hematoma neuro grossly intact    Lab Results: Basic Metabolic Panel:  Recent Labs  13/12/6507/30/15 2016 02/08/14 0400 02/09/14 0240  NA 143 140 139  K 3.9 3.5* 3.7  CL 106 105 105  CO2 23 21 21   GLUCOSE 100* 113* 96  BUN 13 13 13   CREATININE 0.72 0.72 0.78  CALCIUM 8.3* 8.3* 8.6  MG  --  1.9  --   PHOS  --  2.6  --    CBC:  Recent Labs  02/08/14 0400 02/09/14 0240  WBC 9.4 11.8*  HGB 15.6 14.1  HCT 47.5 42.0  MCV 88.1 88.4  PLT 112* 171   Cardiac Enzymes:  Recent Labs  02/07/14 0645 02/07/14 1329 02/07/14 2016  CKTOTAL 821* 1812* 1356*  CKMB 32.8* 110.8* 65.5*  TROPONINI >20.00* >20.00* >20.00*     Assessment/Plan:  1 status post myocardial infarction-patient had PTCA of diagonal. Continue aspirin, brilinta and lipitor. Increase metoprolol to 25 mg po BID. Echocardiogram shows EF 45-50. 2 status post cardiac arrest-occurred in the setting of acute infarct. Increase metoprolol to 25 mg po BID 3 hypokalemia-supplement 4 hypomagnesemia-supplement 5 tobacco abuse-patient counseled  on discontinuing. Transfer to telemetry with potential DC in AM. Olga MillersBrian Crenshaw 02/09/2014, 8:01 AM

## 2014-02-10 DIAGNOSIS — F3162 Bipolar disorder, current episode mixed, moderate: Secondary | ICD-10-CM

## 2014-02-10 DIAGNOSIS — I25119 Atherosclerotic heart disease of native coronary artery with unspecified angina pectoris: Secondary | ICD-10-CM

## 2014-02-10 DIAGNOSIS — I2102 ST elevation (STEMI) myocardial infarction involving left anterior descending coronary artery: Secondary | ICD-10-CM

## 2014-02-10 DIAGNOSIS — I469 Cardiac arrest, cause unspecified: Secondary | ICD-10-CM

## 2014-02-10 MED ORDER — TICAGRELOR 90 MG PO TABS: 90.0000 mg | ORAL_TABLET | Freq: Two times a day (BID) | ORAL | Status: AC

## 2014-02-10 MED ORDER — METOPROLOL TARTRATE 25 MG PO TABS: 25.0000 mg | ORAL_TABLET | Freq: Two times a day (BID) | ORAL | Status: DC

## 2014-02-10 MED ORDER — ASPIRIN 81 MG PO CHEW: 81.0000 mg | CHEWABLE_TABLET | Freq: Every day | ORAL | Status: AC

## 2014-02-10 MED ORDER — ATORVASTATIN CALCIUM 80 MG PO TABS: 80.0000 mg | ORAL_TABLET | Freq: Every day | ORAL | Status: DC

## 2014-02-10 MED ORDER — TICAGRELOR 90 MG PO TABS: 90.0000 mg | ORAL_TABLET | Freq: Two times a day (BID) | ORAL | Status: DC

## 2014-02-10 NOTE — Progress Notes (Signed)
CARDIAC REHAB PHASE I   PRE:  Rate/Rhythm: 68 SR  BP:  Supine:   Sitting: 106/81  Standing:    SaO2:   MODE:  Ambulation: 650 ft   POST:  Rate/Rhythm: 66  BP:  Supine:   Sitting: 115/69  Standing:    SaO2:  0750-0810 Pt walked 650 ft with steady gait. Tolerated well. No CP. Wants to go home. Did not sleep well.   Ian Nuttingharlene Tamantha Saline, RN BSN  02/10/2014 8:08 AM

## 2014-02-10 NOTE — Discharge Summary (Signed)
Physician Discharge Summary     Cardiologist:  Fath  Patient ID: Ian SonsWilliam K Sparger MRN: [id number] DOB/AGE: 57/08/1955 57 y.o.  Admit date: 02/07/2014 Discharge date: 02/10/2014  Admission Diagnoses:    Acute myocardial infarction of other lateral wall, initial episode of care  Discharge Diagnoses:  Active Problems:   Acute myocardial infarction of other lateral wall, initial episode of care   Cardiac arrest   Coronary atherosclerosis of native coronary artery   Bipolar 1 disorder, mixed, moderate   Depression   Acute respiratory failure   Hypotension, unspecified   Hypomagnesemia   Hypokalemia   Tobacco abuse  Discharged Condition: stable  Hospital Course:   58 year old man with a history of coronary artery disease who has had PCI in the past. He had approximately 3 hours of chest discomfort. He called EMS and upon arrival to the [facility], became unresponsive. He had cardiac arrest with his initial rhythm being ventricular fibrillation. He was apparently defibrillated once. He had 5-less than 10 minutes of CPR by one report. He had less than a minute of CPR by another report. He apparently did receive epinephrine and sodium bicarbonate. Due to "agonal" breathing, he was intubated after return of spontaneous circulation. He is transferred here for emergent cardiac catheterization. Patient is intubated and unable to give any further history. All history is obtained from personnel.   She was admitted and underwent emergent left heart cath which revealed normal left main coronary artery. Patent stent in the proximal left anterior descending artery with moderate in-stent restenosis. By intravascular ultrasound, the cross-sectional luminal area did not decrease below 4 mm. Occluded first diagonal which was successfully treated with balloon angioplasty (2.0 mm x 8 balloon), with restoration of TIMI-3 flow. Patent left circumflex artery and its branches. Mild disease in the right coronary  artery.  Moderately decreased left ventricular systolic functionWith anterolateral wall motion abnormality. LVEDP 14 mmHg. Ejection fraction 40%.  She was started on dual antiplatelet with ASA and Brilinta.  IV amiodarone, lipitor 80.  PCCM consulted.  Post cath he was awake and following commands, therefore no hypothermia protocol.   He was extubated on 02/07/14.  2D echo revealed an EF of 45-50%,  Hypokinesis of mid/apical lateral segments and mid/apical anterior segments, mild LVH.  Telemetry continued to show NSVT.  Beta blocker added and subsequently increased to 25mg  BID. Amiodarone stopped.  Magnesium and potassium were replaced.  Tobacco counseling given.  On 02/08/14 the patient ambulated 300 ft with cardiac rehab.  Chest was sore from coughing.  We will consider adding an ACE-I in the office if BP improves.  The patient was seen by Dr. Anne FuSkains who felt he was stable for DC home.  [facility]    Consults: PCCM, diabetes coor.   Significant Diagnostic Studies:   Lipid Panel     Component Value Date/Time   CHOL 147 02/07/2014 0645   TRIG 76 02/07/2014 0645   HDL 32* 02/07/2014 0645   CHOLHDL 4.6 02/07/2014 0645   VLDL 15 02/07/2014 0645   LDLCALC 100* 02/07/2014 0645     Echocardiogram Study Conclusions  - Left ventricle: Hypokinesis of mid/apical lateral segments and mid/apical anterior segments. The cavity size was mildly dilated. Wall thickness was increased in a pattern of mild LVH. Systolic function was mildly reduced. The estimated ejection fraction was in the range of 45% to 50%. - Right ventricle: The cavity size was normal. Systolic function was normal.     PROCEDURE:  Left heart catheterization with selective coronary angiography, left ventriculogram. Percutaneous thrombectomy of the first diagonal; PCI first diagonal; IVUS LAD.   INDICATIONS: Cardiac arrest, lateral ST elevation MI  Emergency consent  PROCEDURE TECHNIQUE: After Xylocaine  anesthesia a 79F slender sheath was placed in the right radial artery with a single anterior needle wall stick. IV heparin was given. Right coronary angiography was done using a Judkins R4 guide catheter. Left coronary angiography was done using a CLS 3.0 guide catheter. The diagonal intervention was performed. Please see below for details. Intravascular ultrasound of the LAD was then performed. Left ventriculography was done using a pigtail catheter. A TR band was used for hemostasis.  CONTRAST: Total of 125 cc.  COMPLICATIONS: None.  HEMODYNAMICS: Aortic pressure was 95/70; LV pressure was 96/9; LVEDP 14. There was no gradient between the left ventricle and aorta.  ANGIOGRAPHIC DATA: The left main coronary artery is widely patent.  The left anterior descending artery is a large vessel which wraps around the apex. There is a stent in the proximal LAD. There is haziness in the proximal portion of the stent. There is mild to moderate disease at the proximal edge of the stent. Before the origin of the stent, there is some dye staining noted in the territory of the first diagonal.  The left circumflex artery is a large vessel. There is a large, branching first obtuse marginal which is widely patent. The remainder of the circumflex is medium size and widely patent.  The right coronary artery is a large, dominant vessel. There is mild, proximal disease. The posterior descending artery is large and widely patent. The posterior lateral artery is medium size and widely patent.  LEFT VENTRICULOGRAM: Left ventricular angiogram was done in the 30 RAO projection and revealed anterolateral hypokinesis and moderately decreased systolic function with an estimated ejection fraction of 40 %. LVEDP was 14 mmHg.  PCI NARRATIVE: A CLS 3.0 guiding catheter was used to engage the left main. IV heparin was given. ACT was used to check that the heparin was therapeutic. IV tirofiban was started. A pro-water wire was placed down  into the LAD. A BMW wire was then advanced into the first diagonal. A pro-water was removed and a priority 1 aspiration catheter was advanced into the diagonal vessel which was occluded. One pass was made. A small amount of debris was removed. A 2.0 x 12 balloon was then used to open up the first diagonal. Multiple inflations were performed. TIMI-3 flow was restored into the diagonal. The vessel increased in size after several doses of intra-coronary nitroglycerin. It still was not bigger than a 2.0 vessel. We went back with a 2.0 balloon and did several more inflations at higher pressure. There is no visible dissection. There is a approximately 40% stenosis in the mid vessel. Given that there is TIMI-3 flow in the vessel was small in diameter, we elected not to stent.  Due to the haziness in the LAD near the origin of this diagonal, there is concern for thrombus in the previously placed LAD stent. A pro-water was placed back into the LAD. The intravascular ultrasound catheter was advanced and IVUS of the LAD was performed. There were several areas of in-stent restenosis, but the luminal area never decreased below 4 mm. The wires were removed. TIMI-3 flow was preserved in the LAD and in the first diagonal.  IMPRESSIONS:  1. Normal left main coronary artery. 2. Patent stent in the proximal left anterior descending artery with moderate in-stent restenosis.  By intravascular ultrasound, the cross-sectional luminal area did not decrease below 4 mm. Occluded first diagonal which was successfully treated with balloon angioplasty (2.0 mm x 8 balloon), with restoration of TIMI-3 flow. 3. Patent left circumflex artery and its branches. 4. Mild disease in the right coronary artery. 5. Moderately decreased left ventricular systolic functionWith anterolateral wall motion abnormality. LVEDP 14 mmHg. Ejection fraction 40%. RECOMMENDATION: The patient be watched in the ICU. I discussed the case with PCCM. The patient will  not be started on the hypothermia protocol. Continue dual antiplatelet therapy for now. It is not mandatory that he maintain dual antiplatelet therapy since a fresh stent was not placed. The diagonal vessel that was occluded his long and does feed a fair amount of myocardium, despite the fact that it is small in diameter. This was the cause of his VF arrest.   Treatments: See above  Discharge Exam: Blood pressure 108/74, pulse 60, temperature 97.6 F (36.4 C), temperature source Oral, resp. rate 18, height 5\' 11"  (1.803 m), weight 202 lb 9.6 oz (91.9 kg), SpO2 97.00%.   Disposition: Final discharge disposition not confirmed      Discharge Instructions   Amb Referral to Cardiac Rehabilitation    Complete by:  As directed   To [facility]     Diet - low sodium heart healthy    Complete by:  As directed      Discharge instructions    Complete by:  As directed   Monitor weight daily.  Low sodium diet.  If you gain 3 pounds in 24 hours, or 5 pounds in a week, call Dr. America Brown office for instructions.     Increase activity slowly    Complete by:  As directed             Medication List         aspirin 81 MG chewable tablet  Chew 1 tablet (81 mg total) by mouth daily.     atorvastatin 80 MG tablet  Commonly known as:  LIPITOR  Take 1 tablet (80 mg total) by mouth daily at 6 PM.     metoprolol tartrate 25 MG tablet  Commonly known as:  LOPRESSOR  Take 1 tablet (25 mg total) by mouth 2 (two) times daily.     ticagrelor 90 MG Tabs tablet  Commonly known as:  BRILINTA  Take 1 tablet (90 mg total) by mouth 2 (two) times daily.       Follow-up Information   Follow up with Dalia Heading., MD. (Call on Monday for a follow up appt with Dr. Lady Gary. )    Specialty:  Cardiology   Contact information:   [address] [place] [address] [phone]      Greater than 30 minutes was spent completing the patient's discharge.    SignedWilburt Finlay, PA-C 02/10/2014,  11:13 AM

## 2014-02-10 NOTE — Progress Notes (Signed)
    Subjective:  No CP. No SOB. Asked a question about neurosurgeon availability.   Objective:  Vital Signs in the last 24 hours: Temp:  [97.6 F (36.4 C)-99.2 F (37.3 C)] 97.6 F (36.4 C) (10/03 0526) Pulse Rate:  [60-87] 60 (10/03 0526) Resp:  [18-20] 18 (10/03 0526) BP: (108-114)/(71-74) 108/74 mmHg (10/03 0526) SpO2:  [97 %-98 %] 97 % (10/03 0526)  Intake/Output from previous day: 10/02 0701 - 10/03 0700 In: 600 [P.O.:600] Out: -    Physical Exam: General: Well developed, well nourished, in no acute distress. Head:  Normocephalic and atraumatic. Lungs: Clear to auscultation and percussion. Heart: Normal S1 and S2.  No murmur, rubs or gallops.  Abdomen: soft, non-tender, positive bowel sounds. Protuberant Extremities: No clubbing or cyanosis. No edema. Neurologic: Alert and oriented x 3.    Lab Results:  Recent Labs  02/08/14 0400 02/09/14 0240  WBC 9.4 11.8*  HGB 15.6 14.1  PLT 112* 171    Recent Labs  02/08/14 0400 02/09/14 0240  NA 140 139  K 3.5* 3.7  CL 105 105  CO2 21 21  GLUCOSE 113* 96  BUN 13 13  CREATININE 0.72 0.78    Recent Labs  02/07/14 1329 02/07/14 2016  TROPONINI >20.00* >20.00*   Hepatic Function Panel  Recent Labs  02/09/14 0240  PROT 6.8  ALBUMIN 3.0*  AST 62*  ALT 67*  ALKPHOS 113  BILITOT 1.0   Telemetry: Rare PVC, no VT Personally viewed.   Assessment/Plan:  Active Problems:   Acute myocardial infarction of other lateral wall, initial episode of care   Cardiac arrest   Coronary atherosclerosis of native coronary artery   Bipolar 1 disorder, mixed, moderate   Depression   Acute respiratory failure   Hypotension, unspecified  Scheduled Meds: . antiseptic oral rinse  7 mL Mouth Rinse BID  . aspirin  81 mg Oral Daily  . atorvastatin  80 mg Oral q1800  . heparin  5,000 Units Subcutaneous 3 times per day  . metoprolol tartrate  25 mg Oral BID  . ticagrelor  90 mg Oral BID   Continuous Infusions:  PRN  Meds:.acetaminophen, ondansetron (ZOFRAN) IV   -OK for DC -DAPT discussed. Importance, risk of stent thrombosis.  -Meds reviewed.  -EF 45%. Consider addition of ACE-I as outpt. BP low currently.   Neftaly Inzunza 02/10/2014, 9:50 AM

## 2014-02-10 NOTE — Progress Notes (Signed)
Pt D/C home.  Alert and oriented x4.  No c/o pain.  Education given on diet, activity, meds, and follow-up care and appointments.  Pt verbalized understanding.  IV D/C'd.  Tele D/C'd.  Pt taken home by daughter.

## 2014-02-11 NOTE — Discharge Summary (Signed)
Personally seen and examined. Agree with above. Doing well post MI See progress note for details Donato SchultzSKAINS, MARK, MD

## 2014-04-19 ENCOUNTER — Encounter (HOSPITAL_COMMUNITY): Payer: Self-pay | Admitting: Interventional Cardiology

## 2014-06-08 ENCOUNTER — Inpatient Hospital Stay: Payer: Self-pay | Admitting: Surgery

## 2014-06-09 LAB — CBC WITH DIFFERENTIAL/PLATELET
BASOS PCT: 0.4 %
Basophil #: 0 10*3/uL (ref 0.0–0.1)
EOS ABS: 0.2 10*3/uL (ref 0.0–0.7)
EOS PCT: 1.7 %
HCT: 42.4 % (ref 40.0–52.0)
HGB: 14 g/dL (ref 13.0–18.0)
LYMPHS PCT: 18.8 %
Lymphocyte #: 2 10*3/uL (ref 1.0–3.6)
MCH: 30 pg (ref 26.0–34.0)
MCHC: 33.1 g/dL (ref 32.0–36.0)
MCV: 91 fL (ref 80–100)
Monocyte #: 1.5 x10 3/mm — ABNORMAL HIGH (ref 0.2–1.0)
Monocyte %: 13.9 %
NEUTROS ABS: 7 10*3/uL — AB (ref 1.4–6.5)
Neutrophil %: 65.2 %
Platelet: 139 10*3/uL — ABNORMAL LOW (ref 150–440)
RBC: 4.67 10*6/uL (ref 4.40–5.90)
RDW: 15.1 % — ABNORMAL HIGH (ref 11.5–14.5)
WBC: 10.8 10*3/uL — ABNORMAL HIGH (ref 3.8–10.6)

## 2014-06-09 LAB — BASIC METABOLIC PANEL
Anion Gap: 4 — ABNORMAL LOW (ref 7–16)
BUN: 10 mg/dL (ref 7–18)
Calcium, Total: 8.3 mg/dL — ABNORMAL LOW (ref 8.5–10.1)
Chloride: 107 mmol/L (ref 98–107)
Co2: 29 mmol/L (ref 21–32)
Creatinine: 0.92 mg/dL (ref 0.60–1.30)
EGFR (African American): >60
EGFR (Non-African Amer.): >60
Glucose: 92 mg/dL (ref 65–99)
OSMOLALITY: 278 (ref 275–301)
POTASSIUM: 3.8 mmol/L (ref 3.5–5.1)
SODIUM: 140 mmol/L (ref 136–145)

## 2014-09-07 ENCOUNTER — Telehealth: Payer: Self-pay | Admitting: *Deleted

## 2014-09-07 NOTE — Telephone Encounter (Signed)
Received paperwrok for prior auth for lipitor 80 mg. Called and spoke with representative, they report the patient does not have benefit coverage at this time.

## 2014-09-09 NOTE — Consult Note (Signed)
   Present Illness 59 yo male with history of coronary artery disease s/p pci of lad and d1 with a cypher drug eluting stent in 2005 . He had a cardiorepsiratory arrest in 9/15 and was transferred to [facility] where cardiac cath revealed patent stents in lad and d1 with no progression of disease. His ef was 50-55. He has apparetnly done well from cardiac standpoint since then He was supposed to be on metoprolol, atorvastatin, asa and ticagrelor since then but pat is not able to tell me if he is taiing these drugs. He denies chest pain. His ekg is pending. He is now admitted with left tib fib fx which will require surgery.   Physical Exam:  GEN well developed, well nourished, no acute distress   HEENT PERRL   NECK supple   RESP clear BS  no use of accessory muscles   CARD Regular rate and rhythm   ABD denies tenderness  normal BS   LYMPH negative neck, negative axillae   EXTR negative cyanosis/clubbing, negative edema   SKIN normal to palpation   NEURO cranial nerves intact, motor/sensory function intact   PSYCH alert, poor insight   Review of Systems:  Subjective/Chief Complaint leg pain   General: No Complaints   Skin: No Complaints   ENT: No Complaints   Eyes: No Complaints   Neck: No Complaints   Respiratory: No Complaints   Cardiovascular: No Complaints   Gastrointestinal: No Complaints   Genitourinary: No Complaints   Vascular: No Complaints   Musculoskeletal: left leg pain   Neurologic: No Complaints   Hematologic: No Complaints   Endocrine: No Complaints   Psychiatric: No Complaints   Review of Systems: All other systems were reviewed and found to be negative   Medications/Allergies Reviewed Medications/Allergies reviewed   Family & Social History:  Family and Social History:  Family History Non-Contributory   Social History positive  tobacco    No Known Allergies:    Impression 6358 with history of cad s/p pci of lad and d1 in 2005  with cath in 9/15 showing patent stents after arrest of unclear etiology. Has been treated medically since. No sob or cp. EKG pending. WIll resume metoprolol at 25 bid and hold asa and brilinta. Will review ekg when available and make further recs for surgery.   Plan 1. Start metoprolol 25 bid 2. EKG stat 3. Further recs after ekg.   Electronic Signatures: Dalia HeadingFath, Kema Santaella A (MD)  (Signed 29-Jan-16 12:41)  Authored: General Aspect/Present Illness, History and Physical Exam, Review of System, Family & Social History, Allergies, Impression/Plan   Last Updated: 29-Jan-16 12:41 by Dalia HeadingFath, Bevin Mayall A (MD)
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
## 2014-09-09 NOTE — Consult Note (Signed)
Brief Consult Note: Diagnosis: Acute closed left distal tib/fib fracture.   Patient was seen by consultant.   Consult note dictated.   Recommend to proceed with surgery or procedure.   Recommend further assessment or treatment.   Orders entered.   Comments: To OR for ORIF left distal tib/fib fx when cleared by Cardiology.  Electronic Signatures: Ninfa LindenPoggi, Jeff (MD)  (Signed 29-Jan-16 18:59)  Authored: Brief Consult Note   Last Updated: 29-Jan-16 18:59 by Ninfa LindenPoggi, Jeff (MD)

## 2014-09-09 NOTE — Discharge Summary (Signed)
PATIENT NAME:  Ian Soto, Ian Soto MR#:  [id number] DATE OF BIRTH:  [birth year]  DATE OF ADMISSION:  06/08/2014 DATE OF DISCHARGE:  06/09/2014  ADMITTING DIAGNOSIS: Left distal tibial and fibula fracture.   DISCHARGE DIAGNOSIS: Left distal tibial and fibula fracture.  OPERATION: On 06/08/2014, the patient had an open reduction and internal fixation of the left distal tibial fracture.   SURGEON: Leron CroakJohn Poggi, MD.   ANESTHESIA: General.   ESTIMATED BLOOD LOSS: 25 mL     IMPLANTS USED:  Biomet Pre-contour distal tibial locking plate system.   COMPLICATIONS: None. The patient was stabilized, brought to the recovery room, and then brought down to the orthopedic floor.   HISTORY OF PRESENT ILLNESS:  The patient is a 59 year old male who presented for a complaint of left ankle pain.  The patient sustained a fall while walking in the rain on 06/08/2014.  The patient had an injury and could not put any pressure on that leg. The patient had x-rays revealing a distal tibia and fibula fracture.  The patient was then set up for possible surgery.   PHYSICAL EXAMINATION:  GENERAL: Alert male in mild discomfort.  LUNGS: Clear to auscultation.  CARDIAC: Regular rate and rhythm. No murmur.  MUSCULOSKELETAL: In regard to the left lower extremity, the patient has significant tenderness even though he is wearing a posterior splint. The patient is neurovascularly intact and has ice on the leg.   HOSPITAL COURSE: After initial admission on 06/08/2014, the patient was brought to the orthopedic floor after surgery.  The patient was able to get up with non-weight-bear with physical therapy and ambulate in the room and was ready to go home on postoperative day 1.   CONDITION AT DISCHARGE: Stable.   DISPOSITION: The patient was sent home.   DISCHARGE INSTRUCTIONS:  The [facility] 3 to 4 days for dressing change. The patient will not put any weight on the affected leg.  The patient will elevate foot with 1 to 2 pillows, and encouraged to do cough and deep breathing.  The patient will resume regular diet. The patient will use ice pack to decrease swelling and try not to get his dressing or splint wet or dirty.  The patient will leave his dressings on.  The patient will call the clinic if there is any bright red bleeding, calf pain, or bowel or bladder difficulty, or any fever greater than 101.5.   DISCHARGE MEDICATIONS: Resume home medications and then to add oxycodone 1 tablet every 4 hours as needed for pain, Tramadol 50 mg 1 tablet every 4 hours as needed for less severe pain, Lovenox 40 mg subcutaneous once a day for 14 days, then discontinue and start on aspirin 81 mg once a day.      ____________________________ Shela CommonsJ. Dedra Skeensodd Ian Soto, [place] jtm:DT D: 06/09/2014 06:47:00 ET T: 06/09/2014 16:35:20 ET JOB#: [id number]  cc: J. Dedra Skeensodd [name], [place], <Dictator> J Gabrianna Fassnacht [facility] Center[facility]UNDY PA ELECTRONICALLY SIGNED 06/13/2014 7:39

## 2014-09-09 NOTE — Consult Note (Signed)
PATIENT NAME:  Ian Soto, Ian Soto MR#:  [id number] DATE OF BIRTH:  Oct 10, 1955  DATE OF CONSULTATION:  06/08/2014  REFERRING PHYSICIAN:   Dr. Manson PasseyBrown CONSULTING PHYSICIAN:   Maryagnes AmosJ. Jeffrey Poggi, MD  HISTORY OF PRESENT ILLNESS:  I have been asked by Dr. Manson PasseyBrown to evaluate this unfortunate man for a left leg injury. Briefly, he is a 59 year old male with a history of coronary artery disease who is status post 2 MIs and status post 2 stent placements, who was in his usual state of health and living at home when he decided to take his dog out for walk last night. Apparently, he slipped on some mud and fell, injuring his left leg. He was brought to the Emergency Room where x-rays demonstrated the above noted findings. The patient denies any associated injuries, and denies any loss of consciousness, lightheadedness, shortness of breath, or chest pain that may have precipitated his fall.   PAST MEDICAL HISTORY: As noted above.   PHYSICAL EXAMINATION: We have a pleasant healthy-appearing middle-aged male in no acute distress. He is alert and oriented x 3.  Orthopedic examination is limited to the left foot and lower leg. The patient is in a short-leg posterior splint as applied by the ER physician upon arrival in the Emergency Room. The patient is able to actively dorsiflex and plantarflex his toes. He has good capillary refill to his toes. Skin inspection shows the skin to be intact at the proximal and distal margins of the splint. After splint removal, the skin is intact and without evidence for rashes, abrasions, erythema, or lacerations. No fracture blisters are noted. He has mild to moderate swelling noted around the ankle.   X-ray data are as noted above. The tibia-fibula films demonstrate not only a distal third tibial shaft/metaphyseal fracture, but proximal and distal third fibular shaft fractures, which are only minimally displaced.   IMPRESSION: Left distal tibia-fibula fracture.   PLAN: The treatment  options are discussed with the patient. The patient was admitted for medical stabilization and pain control prior to being taken to the operating room for definitive management of his injury to include an open reduction internal fixation of the distal tibial fracture. The procedure has been discussed in detail with the patient, as have the potential risks (including bleeding, infection, nerve and/or blood vessel injury, persistent or recurrent pain, stiffness of the knee and/or ankle, malunion and/or nonunion, need for further surgery, blood clots, strokes, heart attacks and/or arrhythmias, etc.) and benefits. The patient states his understanding and wishes to proceed. A formal history and physical has been performed by my PA. We will obtain cardiology clearance prior to the procedure.    ____________________________ J. Derald MacleodJeffrey Poggi, MD jjp:bu D: 06/08/2014 18:58:02 ET T: 06/08/2014 19:34:16 ET JOB#: [id number]  cc: Maryagnes AmosJ. Jeffrey Poggi, MD, <Dictator> JEFF Fidel LevyJ POGGI MD ELECTRONICALLY SIGNED 06/10/2014 11:37

## 2014-09-09 NOTE — Op Note (Signed)
PATIENT NAME:  Ian Soto, DOLLINGER MR#:  [id number] DATE OF BIRTH:  27-Mar-1956  DATE OF PROCEDURE:  06/08/2014  PREOPERATIVE DIAGNOSIS: Closed acute distal tibia-fibula fracture, left leg.   POSTOPERATIVE DIAGNOSIS: Closed acute distal tibia-fibula fracture, left leg.   PROCEDURE: Open reduction and internal fixation of left distal tibia fracture using a Biomet precontoured locking distal tibial plate.   SURGEON: Maryagnes Amos, MD  ANESTHESIA: General LMA.   FINDINGS: As noted above.   COMPLICATIONS: None.   ESTIMATED BLOOD LOSS: Less than 50 mL.   TOTAL FLUIDS: 950 mL of crystalloid.   TOURNIQUET TIME: 78 minutes at 300 mmHg.   DRAINS: None.   CLOSURE: Staples.   BRIEF CLINICAL NOTE: The patient is a 59 year old male who sustained the above-noted injury last night when he apparently slipped on some mud while walking his dog. He was brought to the Emergency Room where x-rays demonstrated the above noted findings. He was admitted for pain control and presents at this time for definitive management of his injury.   PROCEDURE IN DETAIL: The patient was brought into the Operating Room and lain in the supine position. After adequate general laryngeal mask anesthesia was obtained, the patient's left lower extremity was prepped with ChloraPrep solution before being draped sterilely. Preoperative antibiotics were administered. The limb was exsanguinated with an Esmarch and the tourniquet inflated to 300 mmHg. An approximately 4.5 to 5 cm incision was made over the medial aspect of the medial malleolus. The incision was carried down through the subcutaneous tissues. The periosteum was incised to provide access to the bone. The 9-hole distal tibial plate was selected and, after verifying it to be the appropriate length, it was slid up the medial aspect of the distal tibia. After some manipulation, the plate was passed beyond the fracture and advanced proximal to the fracture. Despite numerous  attempts, it was quite difficult to reduce the fracture anatomically. A separate 3 to 4 cm incision was made over the proximal end of the plate to better control the plate. However, this still did not provide anatomic reduction of the fracture. Finally, a third 3.5 to 4 cm incision was made over the fracture site itself. The proximal spike of the distal fragment was noted to be caught in some soft tissue. Once this was freed, the fracture could be reduced anatomically. Therefore, it was stabilized with a bone clamp. The plate was secured using 3 bicortical screws proximally and 1 bicortical screw with 5 locking screws distally. The adequacy of screw position, fracture reduction, and plate position all were verified fluoroscopically numerous times throughout the case and at the end of the case. The fracture appeared to be near anatomically reduced and the plate appeared to be in excellent position. Several screws were exchanged for shorter screws.   The wounds were copiously irrigated with sterile saline solution before the subcutaneous tissues were closed using 2-0 Vicryl interrupted sutures. The skin was closed using staples. A total of 20 mL of 0.5% Marcaine was injected in and around all of the incisions to help with postoperative analgesia before a sterile bulky dressing was applied to the limb. The patient was placed into a posterior splint with a sugar tong supplement, maintaining the ankle in neutral dorsiflexion before he was awakened, extubated, and returned to the recovery room in satisfactory condition after tolerating the procedure well.    ____________________________ J. Derald Macleod, MD jjp:ts D: 06/08/2014 18:50:31 ET T: 06/09/2014 00:30:12 ET JOB#: [id number]  cc: Maryagnes Amos, MD, <  Dictator> JEFF Fidel LevyJ POGGI MD ELECTRONICALLY SIGNED 06/10/2014 11:38

## 2014-10-16 ENCOUNTER — Other Ambulatory Visit: Payer: Self-pay | Admitting: Physician Assistant

## 2014-10-18 ENCOUNTER — Telehealth: Payer: Self-pay | Admitting: Physician Assistant

## 2014-10-18 MED ORDER — ATORVASTATIN CALCIUM 80 MG PO TABS: 80.0000 mg | ORAL_TABLET | Freq: Every day | ORAL | Status: AC

## 2014-10-18 NOTE — Telephone Encounter (Signed)
°  1. Which medications need to be refilled? Atorvastatin 80mg   2. Which pharmacy is medication to be sent to? [facility] in [place] [phone]  3. Do they need a 30 day or 90 day supply? 30 day  4. Would they like a call back once the medication has been sent to the pharmacy? yes

## 2014-10-18 NOTE — Telephone Encounter (Signed)
E-sent to pharmacy PATIENT LIVES IN [place]. PERMANENTLY. Patient aware need to have physician in [place] TO REFILL- after SEPT.2016

## 2014-11-11 ENCOUNTER — Other Ambulatory Visit: Payer: Self-pay | Admitting: Physician Assistant

## 2014-11-13 NOTE — Telephone Encounter (Signed)
Rx(s) sent to pharmacy electronically.  

## 2016-01-28 IMAGING — CR DG CHEST 1V PORT
1 series · 1 of 1 positions shown · non-contrast
Comparison: Portable exam 9464 hr compared to 7707 hr

CLINICAL DATA: Post catheterization, intubation, history coronary
artery disease post MI and cardiac arrest

EXAM:
PORTABLE CHEST - 1 VIEW

[AP]
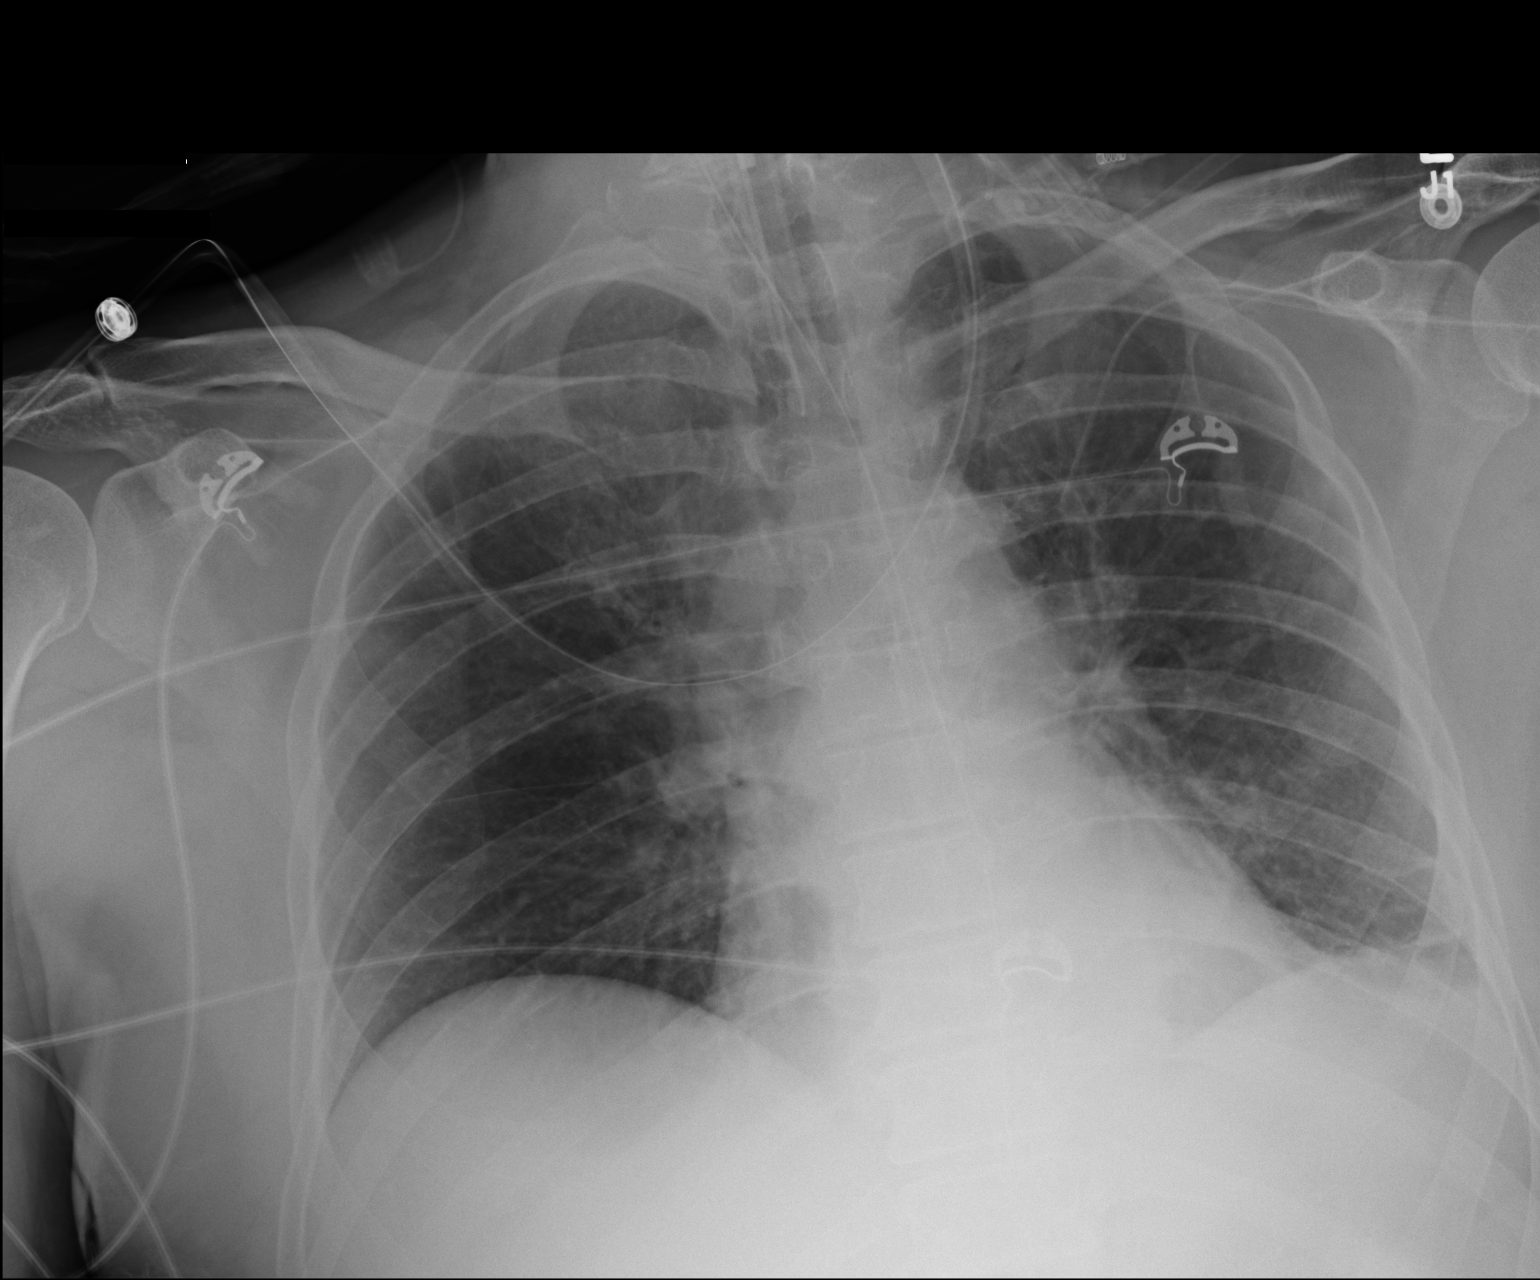

[1 of 1 positions shown; findings below may reference images not displayed]

FINDINGS: Tip of endotracheal tube projects 6.6 cm above carina.

Nasogastric tube extends to at least the inferior mediastinum, tip
not visualized.

Normal heart size, mediastinal contours and pulmonary vascularity.

Subsegmental atelectasis LEFT base.

Remaining lungs clear.

No pleural effusion or pneumothorax.
IMPRESSION: LEFT basilar atelectasis.

## 2016-05-28 IMAGING — CR DG TIBIA/FIBULA 2V*L*
1 series · 3 of 3 positions shown · non-contrast
Comparison: 12/11/2011

CLINICAL DATA: Patient fell wall walking dog causing pain to the
left ankle and lower extremity. Patient felt a pop when he fell.

EXAM:
LEFT TIBIA AND FIBULA - 2 VIEW

[Series 1: ap · 0.17mm/px · 3 of 3 slices shown]
[im 1/3]
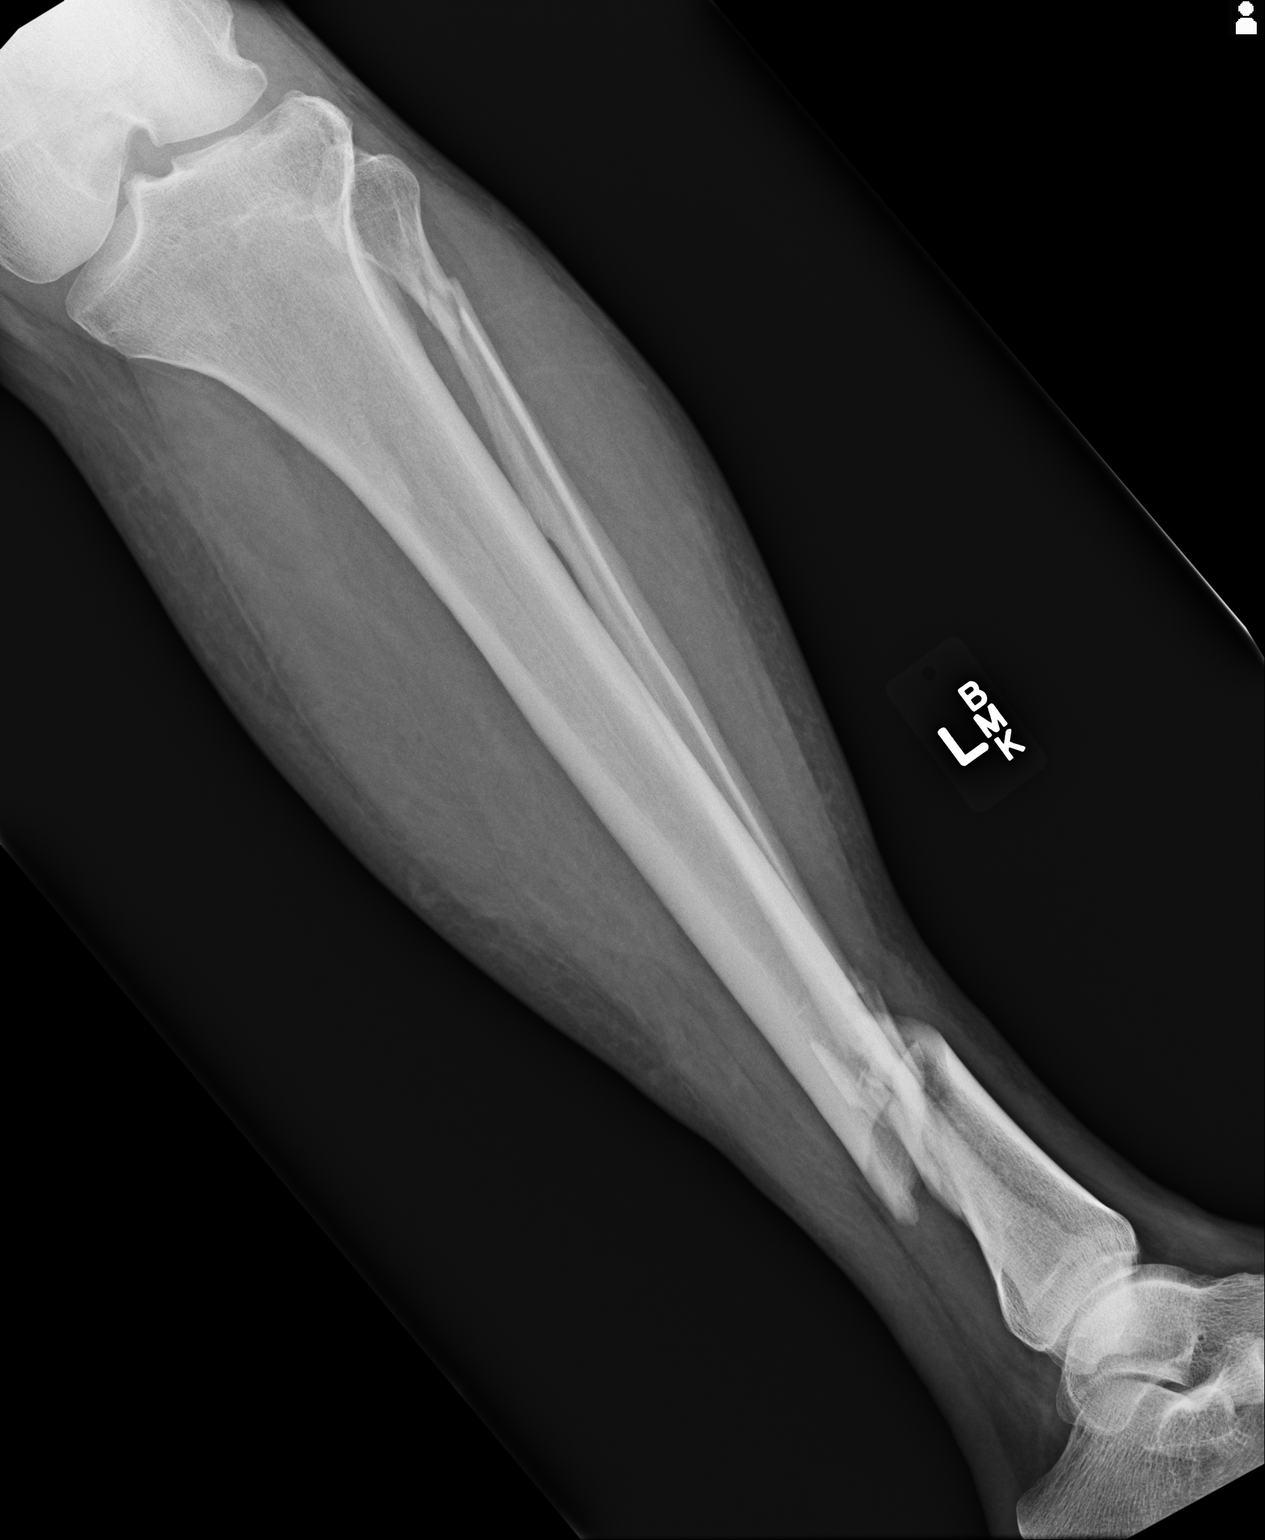
[im 2/3]
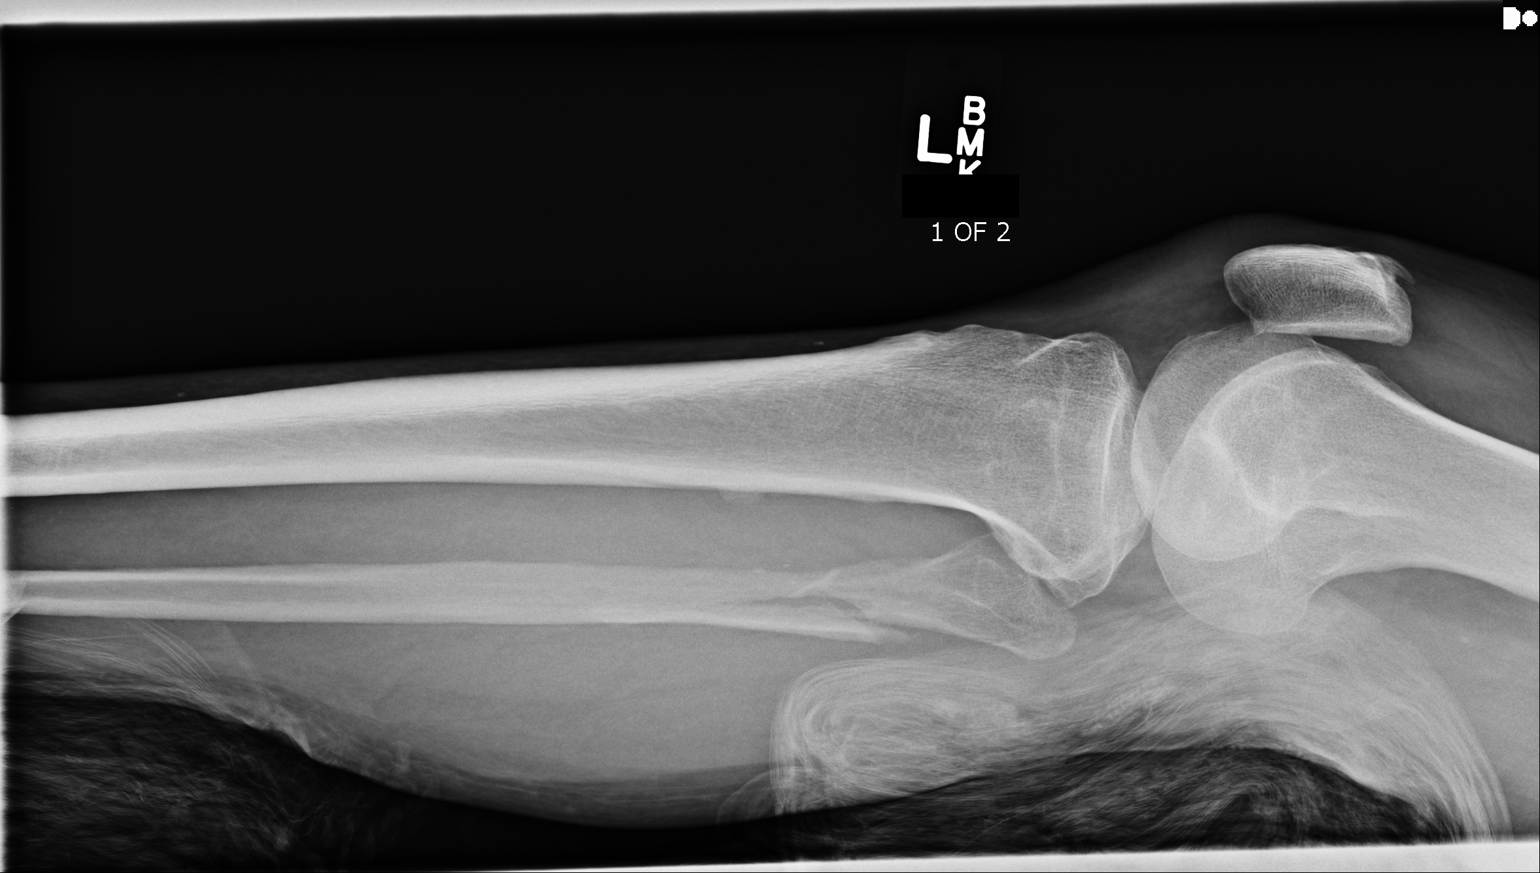
[im 3/3]
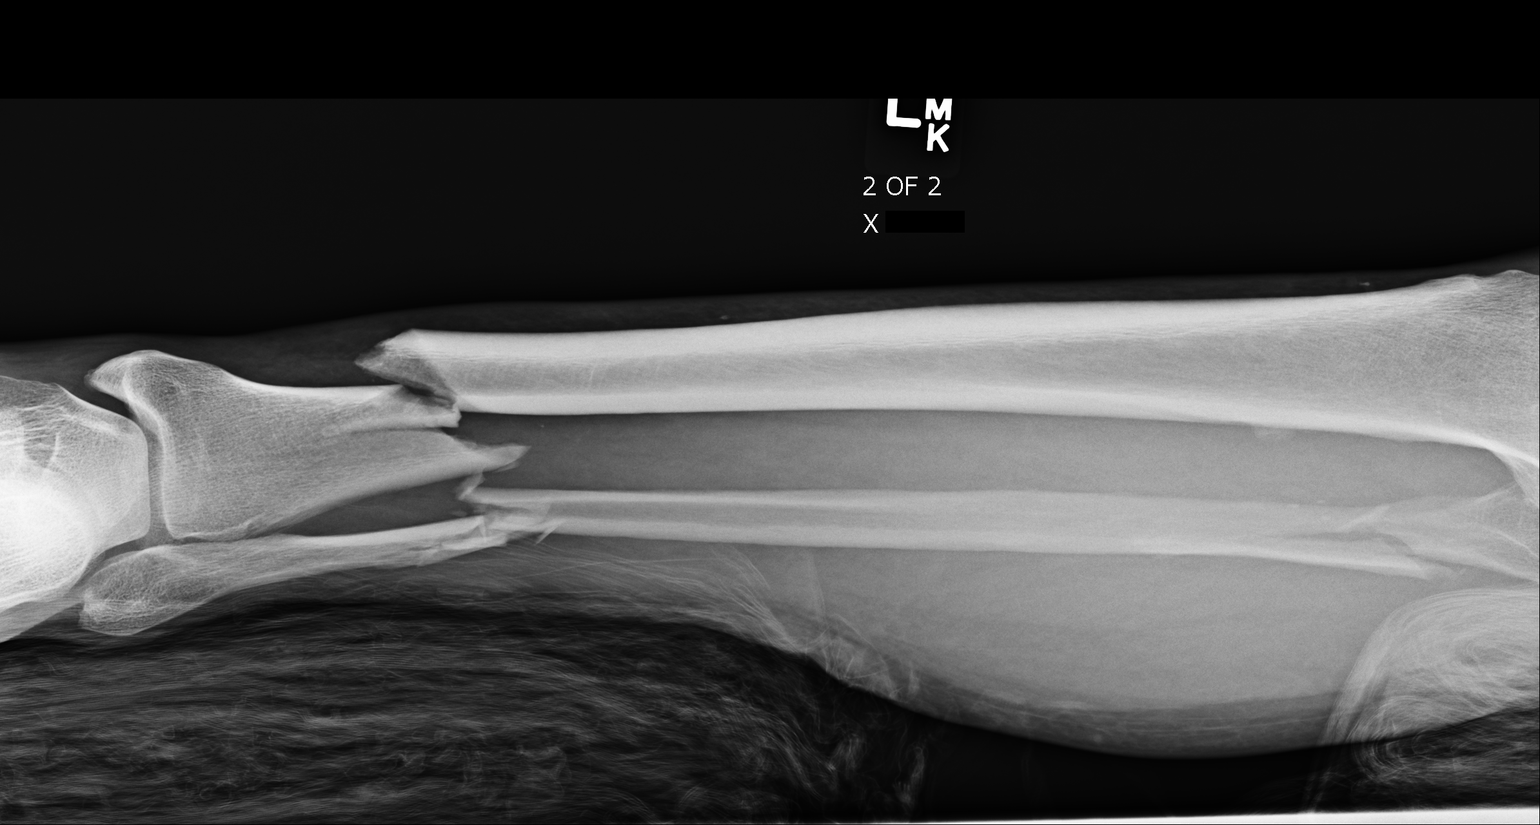

[3 of 3 positions shown; findings below may reference images not displayed]

FINDINGS: There is a comminuted oblique fracture of the distal left tibial
metaphysis with anterior and lateral displacement and mild lateral
angulation of distal fracture fragment with foreshortening and
overriding laterally. Small displaced butterfly fragments are
present.

There is a comminuted transverse fracture of the distal left fibular
shaft with lateral and anterior dislocation of the distal fracture
fragment and posterior dislocation of the butterfly fragment. Mild
lateral angulation of the distal fracture fragment. There is an
additional oblique fracture of the proximal shaft of the left fibula
with mild anterior and medial angulation of the distal fracture
fragment. Diffuse soft tissue swelling.
IMPRESSION: Comminuted displaced fractures of the distal left tibial shaft and
of the proximal and distal left fibular shaft.

## 2019-07-18 ENCOUNTER — Other Ambulatory Visit: Payer: Self-pay | Admitting: Surgery

## 2019-07-20 ENCOUNTER — Other Ambulatory Visit
Admission: RE | Admit: 2019-07-20 | Discharge: 2019-07-20 | Disposition: A | Discharge status: Home / Self Care | Payer: Self-pay | Source: Ambulatory Visit | Attending: Surgery | Admitting: Surgery

## 2019-07-20 NOTE — Patient Instructions (Signed)
Your procedure is scheduled on: 07/25/19 Report to [place]. To find out your arrival time please call [phone] between 1PM - 3PM on 07/24/19.  Remember: Instructions that are not followed completely may result in serious medical risk, up to and including death, or upon the discretion of your surgeon and anesthesiologist your surgery may need to be rescheduled.     _X__ 1. Do not eat food after midnight the night before your procedure.                 No gum chewing or hard candies. You may drink clear liquids up to 2 hours                 before you are scheduled to arrive for your surgery- DO not drink clear                 liquids within 2 hours of the start of your surgery.                 Clear Liquids include:  water, apple juice without pulp, clear carbohydrate                 drink such as Clearfast or Gatorade, Black Coffee or Tea (Do not add                 anything to coffee or tea). Diabetics water only  __X__2.  On the morning of surgery brush your teeth with toothpaste and water, you                 may rinse your mouth with mouthwash if you wish.  Do not swallow any              toothpaste of mouthwash.     _X__ 3.  No Alcohol for 24 hours before or after surgery.   _X__ 4.  Do Not Smoke or use e-cigarettes For 24 Hours Prior to Your Surgery.                 Do not use any chewable tobacco products for at least 6 hours prior to                 surgery.  ____  5.  Bring all medications with you on the day of surgery if instructed.   __X__  6.  Notify your doctor if there is any change in your medical condition      (cold, fever, infections).     Do not wear jewelry, make-up, hairpins, clips or nail polish. Do not wear lotions, powders, or perfumes.  Do not shave 48 hours prior to surgery. Men may shave face and neck. Do not bring valuables to the hospital.    [facility] is not responsible for any belongings or  valuables.  Contacts, dentures/partials or body piercings may not be worn into surgery. Bring a case for your contacts, glasses or hearing aids, a denture cup will be supplied. Leave your suitcase in the car. After surgery it may be brought to your room. For patients admitted to the hospital, discharge time is determined by your treatment team.   Patients discharged the day of surgery will not be allowed to drive home.   Please read over the following fact sheets that you were given:   MRSA Information  __X__ Take these medicines the morning of surgery with A SIP OF WATER:  1.   2.   3.   4.  5.  6.  ____ Fleet Enema (as directed)   __X__ Use CHG Soap/SAGE wipes as directed  ____ Use inhalers on the day of surgery  ____ Stop metformin/Janumet/Farxiga 2 days prior to surgery    ____ Take 1/2 of usual insulin dose the night before surgery. No insulin the morning          of surgery.   ____ Stop Blood Thinners Coumadin/Plavix/Xarelto/Pleta/Pradaxa/Eliquis/Effient/Aspirin  on   Or contact your Surgeon, Cardiologist or Medical Doctor regarding  ability to stop your blood thinners  __X__ Stop Anti-inflammatories 7 days before surgery such as Advil, Ibuprofen, Motrin,  BC or Goodies Powder, Naprosyn, Naproxen, Aleve, Aspirin    __X__ Stop all herbal supplements, fish oil or vitamin E until after surgery.    ____ Bring C-Pap to the hospital.      

## 2019-07-21 ENCOUNTER — Other Ambulatory Visit: Admission: RE | Admit: 2019-07-21 | Payer: Self-pay | Source: Ambulatory Visit

## 2019-07-25 ENCOUNTER — Encounter: Admission: RE | Payer: Self-pay | Source: Home / Self Care

## 2019-07-25 ENCOUNTER — Ambulatory Visit: Admission: RE | Admit: 2019-07-25 | Payer: Self-pay | Source: Home / Self Care | Admitting: Surgery

## 2019-07-25 SURGERY — REMOVAL, HARDWARE
Anesthesia: Choice | Laterality: Left

## 2021-01-17 LAB — EXTERNAL GENERIC LAB PROCEDURE

## 2021-02-27 LAB — EXTERNAL GENERIC LAB PROCEDURE: COLOGUARD: POSITIVE — AB

## 2022-03-20 ENCOUNTER — Other Ambulatory Visit: Payer: Self-pay | Admitting: *Deleted

## 2022-03-20 DIAGNOSIS — Z87891 Personal history of nicotine dependence: Secondary | ICD-10-CM

## 2022-03-20 DIAGNOSIS — F17210 Nicotine dependence, cigarettes, uncomplicated: Secondary | ICD-10-CM

## 2022-03-20 DIAGNOSIS — Z122 Encounter for screening for malignant neoplasm of respiratory organs: Secondary | ICD-10-CM

## 2022-04-15 ENCOUNTER — Ambulatory Visit: Payer: Medicaid Other

## 2022-04-15 ENCOUNTER — Encounter: Payer: PRIVATE HEALTH INSURANCE | Admitting: Primary Care

## 2023-03-30 ENCOUNTER — Other Ambulatory Visit: Payer: Self-pay | Admitting: Infectious Diseases

## 2023-03-30 DIAGNOSIS — I2510 Atherosclerotic heart disease of native coronary artery without angina pectoris: Secondary | ICD-10-CM

## 2023-03-30 DIAGNOSIS — R1032 Left lower quadrant pain: Secondary | ICD-10-CM

## 2023-09-14 ENCOUNTER — Other Ambulatory Visit: Payer: Self-pay | Admitting: Physician Assistant

## 2023-09-14 DIAGNOSIS — R0609 Other forms of dyspnea: Secondary | ICD-10-CM | POA: Diagnosis not present

## 2023-09-14 DIAGNOSIS — R195 Other fecal abnormalities: Secondary | ICD-10-CM | POA: Diagnosis not present

## 2023-09-14 DIAGNOSIS — I2510 Atherosclerotic heart disease of native coronary artery without angina pectoris: Secondary | ICD-10-CM | POA: Diagnosis not present

## 2023-09-14 DIAGNOSIS — E7800 Pure hypercholesterolemia, unspecified: Secondary | ICD-10-CM

## 2023-09-14 DIAGNOSIS — R7303 Prediabetes: Secondary | ICD-10-CM | POA: Diagnosis not present

## 2023-09-14 DIAGNOSIS — Z720 Tobacco use: Secondary | ICD-10-CM | POA: Diagnosis not present

## 2023-09-14 DIAGNOSIS — F17210 Nicotine dependence, cigarettes, uncomplicated: Secondary | ICD-10-CM | POA: Diagnosis not present

## 2023-09-14 DIAGNOSIS — R011 Cardiac murmur, unspecified: Secondary | ICD-10-CM | POA: Diagnosis not present

## 2023-09-14 DIAGNOSIS — E785 Hyperlipidemia, unspecified: Secondary | ICD-10-CM | POA: Diagnosis not present

## 2023-10-08 DIAGNOSIS — I2510 Atherosclerotic heart disease of native coronary artery without angina pectoris: Secondary | ICD-10-CM | POA: Diagnosis not present

## 2023-10-08 DIAGNOSIS — E7800 Pure hypercholesterolemia, unspecified: Secondary | ICD-10-CM | POA: Diagnosis not present

## 2023-10-08 DIAGNOSIS — R0609 Other forms of dyspnea: Secondary | ICD-10-CM | POA: Diagnosis not present

## 2023-10-12 ENCOUNTER — Ambulatory Visit

## 2023-11-09 ENCOUNTER — Ambulatory Visit

## 2023-11-30 ENCOUNTER — Ambulatory Visit
Admission: RE | Admit: 2023-11-30 | Discharge: 2023-11-30 | Disposition: A | Discharge status: Home / Self Care | Source: Ambulatory Visit | Attending: Physician Assistant | Admitting: Physician Assistant

## 2023-11-30 DIAGNOSIS — R0609 Other forms of dyspnea: Secondary | ICD-10-CM | POA: Insufficient documentation

## 2023-11-30 DIAGNOSIS — I2510 Atherosclerotic heart disease of native coronary artery without angina pectoris: Secondary | ICD-10-CM | POA: Insufficient documentation

## 2023-11-30 DIAGNOSIS — E7800 Pure hypercholesterolemia, unspecified: Secondary | ICD-10-CM | POA: Insufficient documentation

## 2023-12-22 DIAGNOSIS — Z720 Tobacco use: Secondary | ICD-10-CM | POA: Diagnosis not present

## 2023-12-22 DIAGNOSIS — Z9889 Other specified postprocedural states: Secondary | ICD-10-CM | POA: Diagnosis not present

## 2023-12-22 DIAGNOSIS — I2129 ST elevation (STEMI) myocardial infarction involving other sites: Secondary | ICD-10-CM | POA: Diagnosis not present

## 2023-12-22 DIAGNOSIS — R011 Cardiac murmur, unspecified: Secondary | ICD-10-CM | POA: Diagnosis not present

## 2023-12-22 DIAGNOSIS — E7800 Pure hypercholesterolemia, unspecified: Secondary | ICD-10-CM | POA: Diagnosis not present

## 2024-02-08 ENCOUNTER — Ambulatory Visit (HOSPITAL_COMMUNITY)
Admission: EM | Admit: 2024-02-08 | Discharge: 2024-02-08 | Disposition: A | Discharge status: Home / Self Care | Attending: Emergency Medicine | Admitting: Emergency Medicine

## 2024-02-08 ENCOUNTER — Encounter (HOSPITAL_COMMUNITY): Payer: Self-pay

## 2024-02-08 DIAGNOSIS — R079 Chest pain, unspecified: Secondary | ICD-10-CM

## 2024-02-08 DIAGNOSIS — F17200 Nicotine dependence, unspecified, uncomplicated: Secondary | ICD-10-CM | POA: Diagnosis not present

## 2024-02-08 NOTE — Discharge Instructions (Addendum)
Go to the ER for further evaluation of chest pain

## 2024-02-08 NOTE — ED Triage Notes (Signed)
 Patient reports that he has tenderness of the mid sternal area x 1 week. Patient states it is tender to touch, certain ways he moves and when he coughs.  Patient states he has been taking Tylenol  and the last dose was at 0930 today.

## 2024-02-08 NOTE — ED Notes (Signed)
 Patient is being discharged from the Urgent Care and sent to the Emergency Department via POV . Per Rilla Flood, NP, patient is in need of higher level of care due to further evaluation of chest pain. Patient is aware and verbalizes understanding of plan of care.  Vitals:   02/08/24 1046  BP: 125/76  Pulse: 63  Resp: 16  Temp: 98.4 F (36.9 C)  SpO2: 93%

## 2024-02-08 NOTE — ED Provider Notes (Signed)
 [facility]    CSN: [id number] Arrival date & time: 02/08/24  1008      History   Chief Complaint No chief complaint on file.   HPI Ian Soto is a 68 y.o. male.   68 year old male pt, Ian Soto, presents to urgent care for evaluation of midsternal chest pain x 1 week.  Patient states he has pain with movement and cough however does not have any URI symptoms.  Patient reports smoking half a pack a day.  Patient has been taking Tylenol  for symptoms without relief, pt denies any fall or trauma  The history is provided by the patient. No language interpreter was used.    Past Medical History:  Diagnosis Date   Acute myocardial infarction of other lateral wall, initial episode of care    Bipolar disorder [facility])    Cardiac arrest (HCC)    Coronary atherosclerosis of native coronary artery    Current smoker    1 pack in 3 days    Patient Active Problem List   Diagnosis Date Noted   Smoker 02/08/2024   Chest pain 02/08/2024   Acute myocardial infarction of other lateral wall, initial episode of care 02/07/2014   Bipolar 1 disorder, mixed, moderate (HCC) 02/07/2014   Depression 02/07/2014   Acute respiratory failure (HCC) 02/07/2014   Hypotension, unspecified 02/07/2014   Cardiac arrest [facility])    Coronary atherosclerosis of native coronary artery     Past Surgical History:  Procedure Laterality Date   LEFT HEART CATHETERIZATION WITH CORONARY ANGIOGRAM N/A 02/07/2014   Procedure: LEFT HEART CATHETERIZATION WITH CORONARY ANGIOGRAM;  Surgeon: Candyce GORMAN Reek, MD;  Location: [facility] CATH LAB;  Service: Cardiovascular;  Laterality: N/A;       Home Medications    Prior to Admission medications   Medication Sig Start Date End Date Taking? Authorizing Provider  aspirin  81 MG chewable tablet Chew 1 tablet (81 mg total) by mouth daily. 02/10/14   Maryjo Dorise ORN, PA-C  atorvastatin  (LIPITOR ) 80 MG tablet Take 1 tablet (80 mg total) by mouth daily at 6 PM. 10/18/14    Maryjo Dorise ORN, PA-C  metoprolol  tartrate (LOPRESSOR ) 25 MG tablet Take 1 tablet (25 mg total) by mouth 2 (two) times daily. <PLEASE MAKE APPOINTMENT FOR REFILLS> 11/13/14   Maryjo Dorise ORN, PA-C  ticagrelor  (BRILINTA ) 90 MG TABS tablet Take 1 tablet (90 mg total) by mouth 2 (two) times daily. Patient not taking: Reported on 02/08/2024 02/10/14   Maryjo Dorise ORN, PA-C    Family History No family history on file.  Social History Social History   Tobacco Use   Smoking status: Every Day    Current packs/day: 0.50    Types: Cigarettes  Vaping Use   Vaping status: Never Used  Substance Use Topics   Alcohol use: No   Drug use: No     Allergies   Patient has no known allergies.   Review of Systems Review of Systems  Constitutional:  Negative for fever.  HENT:  Negative for congestion.   Respiratory:  Negative for cough.   Cardiovascular:  Positive for chest pain and palpitations. Negative for leg swelling.  All other systems reviewed and are negative.    Physical Exam Triage Vital Signs ED Triage Vitals  Encounter Vitals Group     BP 02/08/24 1046 125/76     Girls Systolic BP Percentile --      Girls Diastolic BP Percentile --      Boys  Systolic BP Percentile --      Boys Diastolic BP Percentile --      Pulse Rate 02/08/24 1046 63     Resp 02/08/24 1046 16     Temp 02/08/24 1046 98.4 F (36.9 C)     Temp Source 02/08/24 1046 Oral     SpO2 02/08/24 1046 93 %     Weight --      Height --      Head Circumference --      Peak Flow --      Pain Score 02/08/24 1045 2     Pain Loc --      Pain Education --      Exclude from Growth Chart --    No data found.  Updated Vital Signs BP 125/76 (BP Location: Left Arm)   Pulse 63   Temp 98.4 F (36.9 C) (Oral)   Resp 16   SpO2 93%   Visual Acuity Right Eye Distance:   Left Eye Distance:   Bilateral Distance:    Right Eye Near:   Left Eye Near:    Bilateral Near:     Physical Exam Vitals and nursing note  reviewed.  Cardiovascular:     Rate and Rhythm: Normal rate and regular rhythm.     Heart sounds: Murmur heard.  Pulmonary:     Effort: Pulmonary effort is normal.  Neurological:     General: No focal deficit present.     Mental Status: He is alert and oriented to person, place, and time.     GCS: GCS eye subscore is 4. GCS verbal subscore is 5. GCS motor subscore is 6.  Psychiatric:        Attention and Perception: Attention normal.        Mood and Affect: Mood normal.        Speech: Speech normal.        Behavior: Behavior normal.      UC Treatments / Results  Labs (all labs ordered are listed, but only abnormal results are displayed) Labs Reviewed - No data to display  EKG   Radiology No results found.  Procedures Procedures (including critical care time)  Medications Ordered in UC Medications - No data to display  Initial Impression / Assessment and Plan / UC Course  I have reviewed the triage vital signs and the nursing notes.  Pertinent labs & imaging results that were available during my care of the patient were reviewed by me and considered in my medical decision making (see chart for details).  Clinical Course as of 02/08/24 1742  Tue Feb 08, 2024  1058 EKG shows NSR rate 65, left axis deviation,which is change when compared to 06/08/2014 EKG, QTC 413, No STEMI, no ectopy. [JD]    Clinical Course User Index [JD] Berdena Cisek, Rilla, NP   The patient has been advised that they should be evaluated in the emergency department due to concern for chest pain .   I explained that the emergency room can provide resources not available in the urgent care. They have refused evaluation in the emergency room and declined referral or arrangement of transportation to the emergency room at this time and want to be discharged.  The patient was treated to the extent that they would allow.  The patient had the opportunity to ask questions about their medical condition.   The  benefits of seeking and risks of not seeking emergency room evaluation were discussed and explained to the patient including  but not limited to, worsening of their condition, chronic pain, permanent disability, and death. The patient has decision-making capacity and voiced that they understand the information and the potential consequences of leaving Against Medical Advice.  Ddx: Chest pain, ACS, angina, viral illness, chest wall strain Final Clinical Impressions(s) / UC Diagnoses   Final diagnoses:  Smoker  Chest pain, unspecified type     Discharge Instructions      Go to the ER for further evaluation of chest pain     ED Prescriptions   None    PDMP not reviewed this encounter.   Aminta Loose, NP 02/08/24 1744

## 2024-02-08 NOTE — ED Notes (Signed)
 Patienat states he will probably not go to the ED, but wants to go to his Cardiologist.
